# Patient Record
Sex: Male | Born: 1950 | Race: Black or African American | Hispanic: No | Smoking: Never smoker
Health system: Southern US, Community
[De-identification: ages and names within clinical notes are randomized; demographics above are authoritative.]

## PROBLEM LIST (undated history)

## (undated) DIAGNOSIS — E119 Type 2 diabetes mellitus without complications: Secondary | ICD-10-CM

---

## 2002-02-24 ENCOUNTER — Encounter: Payer: Self-pay | Admitting: Occupational Medicine

## 2002-02-24 ENCOUNTER — Encounter: Admission: RE | Admit: 2002-02-24 | Discharge: 2002-02-24 | Payer: Self-pay | Admitting: Occupational Medicine

## 2006-03-13 ENCOUNTER — Inpatient Hospital Stay (HOSPITAL_COMMUNITY): Admission: EM | Admit: 2006-03-13 | Discharge: 2006-03-18 | Payer: Self-pay | Admitting: *Deleted

## 2006-04-12 ENCOUNTER — Ambulatory Visit (HOSPITAL_COMMUNITY): Admission: RE | Admit: 2006-04-12 | Discharge: 2006-04-12 | Payer: Self-pay | Admitting: Gastroenterology

## 2006-05-13 ENCOUNTER — Ambulatory Visit: Payer: Self-pay | Admitting: Nurse Practitioner

## 2006-05-14 ENCOUNTER — Ambulatory Visit: Payer: Self-pay | Admitting: *Deleted

## 2007-01-22 ENCOUNTER — Encounter (INDEPENDENT_AMBULATORY_CARE_PROVIDER_SITE_OTHER): Payer: Self-pay | Admitting: *Deleted

## 2007-09-05 ENCOUNTER — Encounter (INDEPENDENT_AMBULATORY_CARE_PROVIDER_SITE_OTHER): Payer: Self-pay | Admitting: Nurse Practitioner

## 2007-09-05 ENCOUNTER — Ambulatory Visit: Payer: Self-pay | Admitting: Internal Medicine

## 2007-09-05 LAB — CONVERTED CEMR LAB
Albumin: 4.4 g/dL (ref 3.5–5.2)
BUN: 12 mg/dL (ref 6–23)
Calcium: 9.7 mg/dL (ref 8.4–10.5)
Chloride: 105 meq/L (ref 96–112)
Eosinophils Absolute: 0.1 10*3/uL (ref 0.0–0.7)
Glucose, Bld: 131 mg/dL — ABNORMAL HIGH (ref 70–99)
Hemoglobin: 18.1 g/dL — ABNORMAL HIGH (ref 13.0–17.0)
Lymphs Abs: 2.3 10*3/uL (ref 0.7–4.0)
MCV: 109 fL — ABNORMAL HIGH (ref 78.0–100.0)
Monocytes Relative: 7 % (ref 3–12)
Neutro Abs: 5 10*3/uL (ref 1.7–7.7)
Neutrophils Relative %: 63 % (ref 43–77)
PSA: 1.07 ng/mL (ref 0.10–4.00)
Potassium: 4.3 meq/L (ref 3.5–5.3)
RBC: 4.9 M/uL (ref 4.22–5.81)
WBC: 7.9 10*3/uL (ref 4.0–10.5)

## 2007-09-19 ENCOUNTER — Ambulatory Visit: Payer: Self-pay | Admitting: Oncology

## 2007-11-08 ENCOUNTER — Inpatient Hospital Stay (HOSPITAL_COMMUNITY): Admission: EM | Admit: 2007-11-08 | Discharge: 2007-11-17 | Payer: Self-pay | Admitting: Emergency Medicine

## 2010-01-02 ENCOUNTER — Ambulatory Visit: Payer: Self-pay | Admitting: Internal Medicine

## 2010-03-02 ENCOUNTER — Ambulatory Visit: Payer: Self-pay | Admitting: Internal Medicine

## 2010-09-19 NOTE — Consult Note (Signed)
Randy Nielsen, BELLANCA NO.:  [id number]   MEDICAL RECORD NO.:  [id number]          PATIENT TYPE:  INP   LOCATION:  5506                         FACILITY:  [facility]   PHYSICIAN:  Shirley Friar, MDDATE OF BIRTH:  28-Aug-1950   DATE OF CONSULTATION:  DATE OF DISCHARGE:                                 CONSULTATION   REQUESTING PHYSICIAN:  Dr. Ashley Royalty.   REASON FOR CONSULTATION:  Pancreatitis.   HISTORY OF PRESENT ILLNESS:  Randy Nielsen is a 60 year old black male  admitted on November 08, 2007, secondary to acute pancreatitis with a lipase  of 1928.  CT scan done on day of admission also showed enlarged  edematous pancreas with extensive peripancreatic edema compatible with  acute pancreatitis.  There is also intra and extrahepatic biliary ductal  dilatation seen without a definite pancreatic mass or stone noted.  Common bile duct was noted to measure 16 mm in diameter above the  pancreatic head and 13 mm within the pancreatic head.  No pseudocysts  were seen.  Randy Nielsen abdominal pain has resolved, and he has been on  a solid food without any problems.  His lipase has decreased from 1928  down to 171 on November 13, 2007, but has increased to 272 on November 15, 2007.  He continues to deny any abdominal pain.  His liver tests were last  checked on November 12, 2007, and his bilirubin was 3.0 up from 1.4, ALP was  90, AST 43, ALT 26.  In 2007, the patient had an episode of pancreatitis  and a CT at that time showed what appeared to be a possible pancreatic  head mass and his CA-19-9 was also elevated up to 3128.  He was  recommended to have a endoscopic ultrasound at that time and appears he  did not follow up for that.  He denies any recent alcohol use but  reports a remote history of alcohol abuse years ago.   PAST MEDICAL HISTORY:  Diabetes mellitus, otherwise negative except as  stated above.   MEDICATIONS:  Insulin, multivitamin, Protonix, thiamine, subcu heparin.   ALLERGIES:  No known drug allergies.   FAMILY HISTORY:  Noncontributory.   SOCIAL HISTORY:  History of alcohol abuse (denies any alcohol abuse  currently).  He says he drinks beer occasionally.   PHYSICAL EXAMINATION:  VITAL SIGNS:  Temperature 98.9, pulse 86, blood  pressure 157/94, O2 sat is 96% on room air.  GENERAL:  Alert and in no acute stress.  ABDOMEN:  Soft, nontender, and nondistended.  Active bowel sounds.   IMPRESSION:  A 60 year old black male with acute pancreatitis in the  setting of history of alcohol abuse.  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  I would recommend a repeat CT scan to look for any  evolving pseudocysts.  In 4 to 8 weeks, I will recommend an endoscopic  ultrasound to further evaluate his pancreatic parenchyma, look for any  subtle pancreatic lesions.  This was recommended in 2007 as well and  appears that he did not follow through on  this recommendation.  I will keep him n.p.o. after midnight for repeat  CT scan.  If that is negative, then we will do an endoscopic ultrasound  as an outpatient.  He may need to be on pancreatic enzymes as well, but  would recommend holding off on that until his endoscopic ultrasound.      Shirley Friar, MD  Electronically Signed     VCS/MEDQ  D:  11/15/2007  T:  11/16/2007  Job:  [id number]

## 2010-09-19 NOTE — Discharge Summary (Signed)
Nielsen, Randy NO.:  [id number]   MEDICAL RECORD NO.:  [id number]          PATIENT TYPE:  INP   LOCATION:  5506                         FACILITY:  [facility]   PHYSICIAN:  Altha Harm, MDDATE OF BIRTH:  December 11, 1950   DATE OF ADMISSION:  11/07/2007  DATE OF DISCHARGE:                               DISCHARGE SUMMARY   DISCHARGE DISPOSITION:  Home.   DISCHARGE DIAGNOSES:  1. Acute pancreatitis with pseudocyst.  2. Abdominal pain, resolved.  3. Emesis, resolved.  4. Diabetes type 2.  5. Hypokalemia, resolved.  6. History of hypertension, blood pressure is controlled without      medications.  7. Leukocytosis, resolved.   DISCHARGE MEDICATIONS:  1. Insulin NPH on a sliding scale.  2. Multivitamin 1 tablet p.o. daily.  3. Protonix 40 mg p.o. daily.  4. Thiamine 100 mg p.o. daily.  5. Folate 1 mg p.o. daily.   CONSULTANTS:  Dr. Shirley Friar, gastroenterology.   PROCEDURES:  None.   DIAGNOSTIC STUDIES:  1. Abdominal x-ray two-view, which shows no acute abnormalities.  2. Abdominal CT with contrast which shows enlarged edematous pancreas      with extensive peripancreatic edema, compatible with acute      pancreatitis.  Intrahepatic and extrahepatic biliary dilatation      without definite visualization of the pancreas, mass or stone.  A      follow-up CT examination following resolution of acute inflammatory      process recommended to exclude underlying obstructing mass of the      pancreatic head.  3. Repeat CT with contrast done on October 17, 2007, which showed      amorphous soft tissue mass in the head of the pancreas with      measurements given above.  This lesion was present on a CT      examination from December 2007, and has increased only minimally in      size since that time.  A very slow-growing pancreatic neoplasm is      present, which may protrude into the duodenum.  It accounts for the      intra and extrahepatic biliary  ductal dilatation.  4. Regional lymphadenopathy, unchanged from in the prior CTs in      December 2007.  5. Developing large pseudocyst between the stomach and the pancreas.      Smaller developing pseudocyst inferior to the pancreas.  Possible      developing pseudocyst in the gastrohepatic ligament.  No evidence      of pancreatic necrosis.  6. Mild bibasilar atelectasis, left greater than right.  7. Small left pleural effusion.   PERTINENT LABORATORY STUDIES:  Lipase on admission is 1982.  LFTs were  normal at that time.  Lipase at the time of discharge 292.  A cancer  antigen GI 19-9 was done, which was 361.2, which is significantly down  from the antigen level in 2007, which at that time was 3128.7 in  November 2007.   CHIEF COMPLAINT:  Abdominal pain.   HISTORY OF PRESENT ILLNESS:  Please see the H&P  dictated by Dr. Corky Downs  for details of the HPI.   HOSPITAL COURSE:  1. Acute pancreatitis.  The patient was given bowel rest and      aggressive IV hydration.  The patient resolved his pain and      vomiting and was started on clear liquid diet and advanced.      However, the patient's enzymes started to increase, which did not      follow up the expected clinical course.  The patient was again made      n.p.o. without any good resolution of the enzymes.  Repeat CT scan      was done which shows a pseudocyst and possibility of a mass of the      pancreatic head.  Gastroenterology was consulted.  The patient was      recommended for endoscopic ultrasound to be done in approximately 2-      3 weeks.  The patient in the past in 2007, had been recommended to      have an endoscopic ultrasound done; however, he did not follow up      with Dr. Bosie Clos at that time.  On this admission, I made contact      with Dr. Christella Hartigan, who does the endoscopic ultrasounds and the      patient is to call Dr. Christella Hartigan with the number provided, [phone],      to make an appointment in 2 weeks.  In terms  of the patient's      current acute course, the pancreatic enzymes have started to      decrease and are now down to 292.  I have discussed with Dr.      Bosie Clos, who feels that the patient can be discharged home to      follow up with him in 1 week to have his labs checked and for      evaluation in the outpatient setting.  Thus, the patient is being      discharged home with instructions to follow up with Dr. Bosie Clos in      1 week and to have a low-fat diet.  In terms of the diabetes type      2, the patient required only minimal sliding scale correction doses      while hospitalized and his blood pressures were well controlled.      The patient is being discharged with NPH insulin on a sliding      scale.  The patient does not have a primary care physician and has      been counseled that given his chronic diseases it is rather      important that he establish with a primary care physician as an      outpatient.  His hemoglobin A1c on this admission was 7.2, which      demonstrates poor control of his diabetes.  [facility]  Otherwise, the patient has remained stable and is being      discharged home with the above instructions.   DIETARY RESTRICTIONS:  The patient should be on a diabetic low-fat diet.   PHYSICAL RESTRICTIONS:  None.      Altha Harm, MD  Electronically Signed     MAM/MEDQ  D:  11/17/2007  T:  11/17/2007  Job:  [id number]   cc:   Laurena Bering.  Bosie Clos, MD  Christella Hartigan, MD
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
## 2010-09-22 NOTE — H&P (Signed)
NAME:  Randy Nielsen, Randy Nielsen NO.:  [id number]   MEDICAL RECORD NO.:  [id number]          PATIENT TYPE:  EMS   LOCATION:  MAJO                         FACILITY:  [facility]   PHYSICIAN:  Lucita Ferrara, MD         DATE OF BIRTH:  1951/01/28   DATE OF ADMISSION:  03/12/2006  DATE OF DISCHARGE:                                HISTORY & PHYSICAL   HISTORY OF PRESENT ILLNESS:  The patient is a 60 year old male with no  significant past medical history who presents in the emergency room after 3-  4 week mid epigastric abdominal pain that progressively got worse and became  excruciating.  Pain is currently 10/10 in intensity.  Three to four weeks  ago abdominal pain was accompanied with nausea and vomiting.  He states that  he started vomiting with the smell of any type of food.  His nausea,  vomiting and abdominal pain became progressively worse to the point where he  could not tolerate any p.o. intake today.  He said that over the last 3-4  weeks he lost a significant amount of weight.  He denied any chest pain,  related cardiac or shortness of breath.  He denies any blood in the stool,  changes in bowel habits.   PAST MEDICAL HISTORY:  None.   SOCIAL HISTORY:  The patient is a smoker within the last 12 months.  He  smokes a half pack per day.  He is an occasional drinker.  He drinks about  half pint per week.  He denies any drug abuse.   FAMILY HISTORY:  He did not remember history of father and mother.  He did  not have close contact with them.   PAST SURGICAL HISTORY:  He has right wrist surgery.  Other than that, no  significant surgical or past medical history.   ALLERGIES:  No known drug allergies.   MEDICATIONS:  None.   PHYSICAL EXAMINATION:  GENERAL:  The patient is in no respiratory distress.  Although, his abdominal pain is causing significant amount of distress.  Well controlled on pain medications.  He is alert and oriented.  VITAL SIGNS:  Blood pressure 185/112  with pain control down to 146/82,  temperature 97, pulse 100, respirations 20.  HEENT:  Normocephalic, atraumatic.  Sclerae are anicteric.  No jaundice  noted.  NECK:  No JVD or carotid bruits.  Supple.  CARDIOVASCULAR:  S1, S2.  Regular rate and rhythm.  No murmurs, rubs or  clicks.  LUNGS:  Clear to auscultation bilaterally.  Good air movement.  No wheezes  or rhonchi.  ABDOMEN:  Mildly distended in mid epigastric region.  There is some  guarding.  There is tenderness to light palpation.  There is good bowel  sounds.  Does not look like a surgical acute abdomen.  EXTREMITIES:  No cyanosis, clubbing or edema.  NEUROLOGICAL:  Alert and oriented x3.  Cranial nerves 2-3 grossly intact.  Pulses 2+ bilaterally upper and lower extremities.   LABORATORY DATA:  White count was 9.2, hemoglobin 17.9, hematocrit 52,  platelets 162.  Urinalysis showed trace hemoglobin.  Essentially, urinalysis  was negative.  Sodium 129, potassium 4.4, chloride 91, CO2 16, glucose 468,  BUN 23, creatinine 1.4, calcium 8.8, AST 28, ALT 62, alk-phos 231, bilirubin  3.1, lipase was 4613.  CT scan of the abdomen was significant for pancreatic  head mass causing biliary dilatation, worrisome for pancreatic malignancy.  In addition, there was significant stranding of the pancreas which could be  caused either by inflammation of the pancrease versus tumor infiltration of  the pancreas.  Pain was well controlled with the Dilaudid and the nausea and  vomiting was well controlled with Zofran.   ASSESSMENT/PLAN:  This is a 60 year old male with:  1. Pancreatic insufficiencies causing secondary diabetes.  He is currently      clinically diabetic ketoacidosis.  Will go ahead and treat him with      aggressive fluid, hydration and admit him to the intensive care unit      for IV insulin drip.  2. 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  I spoke to the patient.      I explained that this may be the case.  The patient  seemed, although in      shock, but understands that this is a possibility.  I told him about      his hospital course.  I told him that we may get consultants to help [place]      with this situation.  I think that it is prudent to first control his      diabetic ketoacidosis, his hydration status and his pancreatitis before      the ultimate goal of dealing with his pancreatic head mass.   I have explained the hospital course and procedures, and the patient  understands.      Lucita Ferrara, MD  Electronically Signed     RR/MEDQ  D:  03/13/2006  T:  03/13/2006  Job:  [id number]

## 2010-09-22 NOTE — Discharge Summary (Signed)
Randy Nielsen, Randy Nielsen NO.:  [id number]   MEDICAL RECORD NO.:  [id number]          PATIENT TYPE:  INP   LOCATION:  3034                         FACILITY:  [facility]   PHYSICIAN:  Deirdre Peer. Polite, M.D. DATE OF BIRTH:  [birth year]   DATE OF ADMISSION:  03/12/2006  DATE OF DISCHARGE:                               DISCHARGE SUMMARY   DISCHARGE DIAGNOSES:  1. Newly diagnosed diabetes, discharged on Lantus and oral      hypoglycemic.  Will be scheduled for outpatient diabetes education.  2. Pancreatic mass, suspicious for cancer.  CA 19-9 elevated at 3128.  3. Tobacco use, recommend stop.   DISCHARGE MEDICATIONS:  1. Lantus 20 units daily.  2. Amaryl for 4 mg daily.  At some point BP medication may be      recommended, i.e., ACE inhibitor.   DISPOSITION:  Being discharged home in stable condition.  Asked to  follow with Dr. Madilyn Fireman this week, Thursday or Friday.  Number has been  provided by Dr. Madilyn Fireman partner.  The patient will require diabetic  education on an outpatient basis and will need further studies in  reference to his pancreatic mass to rule out cancer but more than  likely, will require endoscopic ultrasound with biopsy.   STUDIES:  Hemoglobin A1c 16.6.  Lipase on admission 4613, as low as 343.  CA 19-9 3128.  B-MET unremarkable at discharge except for hyperglycemia.  CT:  Pancreatic head mass.   HISTORY OF PRESENT ILLNESS:  A 60 year old male and no past medical  history presented to the ED with nausea, vomiting, and abdominal pain.  The patient was evaluated and found to be hyperglycemic.  The patient  had imaging of his belly because of significant abdominal pain and found  to have a mass at the head of the pancreas.  Admission was deemed  necessary for further evaluation and treatment.  Please see dictated H&P  for further details.   PAST SURGICAL HISTORY:  None.   MEDICATIONS ON ADMISSION:  None.   SOCIAL HISTORY:  Positive for tobacco.  Social  alcohol.  No drugs.   FAMILY HISTORY:  Per admission H&P.   REVIEW OF SYSTEMS:  Per admission H&P.   ALLERGIES:  NO KNOWN DRUG ALLERGIES.   HOSPITAL COURSE:  The patient was admitted to ICU for evaluation and  treatment of the above problems.  The patient was initially treated with  IV insulin until optimal blood sugar control was obtained and resolution  of mild metabolic acidosis.  The patient was converted over to sliding-  scale insulin as well as Lantus.  The patient continued to have  hyperglycemia during hospitalization and managed with titrated insulin  as his diet allowed.  As for the evaluation of the patient's pancreatic  mass, the patient was seen by [facility].  Due to the patient's  pancreatitis and not wanting to exacerbate this, invasive studies have  been placed on hold.  The patient has been without nausea and vomiting  and had complete resolution of abdominal pain.  Further studies were  ordered, as stated above.  Clinically, the patient is doing well now  from the standpoint of abdominal pain and again tolerating a full diet  without nausea or vomiting.  The patient has been cleared for discharged  by [facility] with further outpatient followup, for endoscopic ultrasound  and biopsy.  This has been explained to the patient.  The patient will  be scheduled for diabetic education on an outpatient basis and has been  recommended to follow up with [facility].  At this time, patient is  medically stable for discharge.      Deirdre Peer. Polite, M.D.  Electronically Signed     RDP/MEDQ  D:  03/18/2006  T:  03/19/2006  Job:  [id number]   cc:   Everardo All. Madilyn Fireman, M.D.

## 2011-02-01 LAB — BASIC METABOLIC PANEL
BUN: 6
BUN: 8
CO2: 21
CO2: 23
CO2: 27
CO2: 28
Calcium: 8.1 — ABNORMAL LOW
Chloride: 102
Chloride: 103
Chloride: 109
Creatinine, Ser: 0.87
Creatinine, Ser: 0.92
GFR calc non Af Amer: >60
Glucose, Bld: 123 — ABNORMAL HIGH
Glucose, Bld: 125 — ABNORMAL HIGH
Glucose, Bld: 138 — ABNORMAL HIGH
Potassium: 3.8
Potassium: 3.8
Potassium: 5
Sodium: 139
Sodium: 139

## 2011-02-01 LAB — CBC
HCT: 41.2
HCT: 44.3
HCT: 53.4 — ABNORMAL HIGH
HCT: 54.8 — ABNORMAL HIGH
Hemoglobin: 15.8
Hemoglobin: 18.2 — ABNORMAL HIGH
Hemoglobin: 19.1 — ABNORMAL HIGH
MCHC: 34
MCHC: 35.1
MCHC: 35.5
MCHC: 35.6
MCHC: 35.7
MCV: 106.9 — ABNORMAL HIGH
MCV: 107.6 — ABNORMAL HIGH
MCV: 108.1 — ABNORMAL HIGH
MCV: 108.9 — ABNORMAL HIGH
Platelets: 130 — ABNORMAL LOW
Platelets: 135 — ABNORMAL LOW
Platelets: 198
RBC: 3.85 — ABNORMAL LOW
RBC: 3.87 — ABNORMAL LOW
RDW: 13.9
RDW: 14.4
RDW: 15
WBC: 10.8 — ABNORMAL HIGH
WBC: 16.5 — ABNORMAL HIGH
WBC: 9.9

## 2011-02-01 LAB — COMPREHENSIVE METABOLIC PANEL
ALT: 26
AST: 24
AST: 43 — ABNORMAL HIGH
Albumin: 2.5 — ABNORMAL LOW
Albumin: 2.7 — ABNORMAL LOW
Albumin: 3.9
Alkaline Phosphatase: 83
BUN: 8
BUN: 9
CO2: 25
Calcium: 8.4
Calcium: 8.8
Chloride: 105
Chloride: 99
Creatinine, Ser: 0.83
Creatinine, Ser: 0.86
GFR calc Af Amer: >60
GFR calc Af Amer: >60
GFR calc non Af Amer: >60
Glucose, Bld: 284 — ABNORMAL HIGH
Potassium: 4.9
Sodium: 135
Total Bilirubin: 1
Total Bilirubin: 1.4 — ABNORMAL HIGH
Total Protein: 6.5

## 2011-02-01 LAB — DIFFERENTIAL
Basophils Absolute: 0
Basophils Relative: 0
Basophils Relative: 0
Eosinophils Absolute: 0
Eosinophils Absolute: 0.2
Eosinophils Relative: 0
Eosinophils Relative: 2
Lymphs Abs: 1.9
Lymphs Abs: 2
Monocytes Absolute: 0.7
Monocytes Relative: 14 — ABNORMAL HIGH
Monocytes Relative: 15 — ABNORMAL HIGH
Neutro Abs: 14.9 — ABNORMAL HIGH
Neutro Abs: 7.6
Neutrophils Relative %: 64
Neutrophils Relative %: 66

## 2011-02-01 LAB — URINALYSIS, ROUTINE W REFLEX MICROSCOPIC
Hgb urine dipstick: NEGATIVE
Protein, ur: 100 — AB
Urobilinogen, UA: 1

## 2011-02-01 LAB — HEPATIC FUNCTION PANEL
ALT: 26
Alkaline Phosphatase: 96
Bilirubin, Direct: 0.3
Indirect Bilirubin: 0.6
Total Protein: 6.7

## 2011-02-01 LAB — LIPASE, BLOOD
Lipase: 171 — ABNORMAL HIGH
Lipase: 194 — ABNORMAL HIGH
Lipase: 292 — ABNORMAL HIGH

## 2011-02-01 LAB — CANCER ANTIGEN 19-9: CA 19-9: 361.2 — ABNORMAL HIGH (ref <35.0)

## 2011-02-01 LAB — LIPID PANEL
Cholesterol: 125
HDL: 20 — ABNORMAL LOW
Triglycerides: 138

## 2011-02-01 LAB — HEMOGLOBIN A1C: Hgb A1c MFr Bld: 7.2 — ABNORMAL HIGH

## 2011-02-01 LAB — URINE MICROSCOPIC-ADD ON

## 2017-09-09 ENCOUNTER — Inpatient Hospital Stay (HOSPITAL_COMMUNITY)
Admission: EM | Admit: 2017-09-09 | Discharge: 2017-09-16 | DRG: 435 | Disposition: A | Discharge status: Hospice | Payer: Medicare Other | Attending: Internal Medicine | Admitting: Internal Medicine

## 2017-09-09 ENCOUNTER — Emergency Department (HOSPITAL_COMMUNITY): Payer: Medicare Other

## 2017-09-09 DIAGNOSIS — R188 Other ascites: Secondary | ICD-10-CM

## 2017-09-09 DIAGNOSIS — C787 Secondary malignant neoplasm of liver and intrahepatic bile duct: Secondary | ICD-10-CM | POA: Diagnosis present

## 2017-09-09 DIAGNOSIS — K767 Hepatorenal syndrome: Secondary | ICD-10-CM | POA: Diagnosis present

## 2017-09-09 DIAGNOSIS — B1920 Unspecified viral hepatitis C without hepatic coma: Secondary | ICD-10-CM | POA: Diagnosis present

## 2017-09-09 DIAGNOSIS — D6959 Other secondary thrombocytopenia: Secondary | ICD-10-CM | POA: Diagnosis present

## 2017-09-09 DIAGNOSIS — Z7189 Other specified counseling: Secondary | ICD-10-CM

## 2017-09-09 DIAGNOSIS — N170 Acute kidney failure with tubular necrosis: Secondary | ICD-10-CM | POA: Diagnosis present

## 2017-09-09 DIAGNOSIS — R17 Unspecified jaundice: Secondary | ICD-10-CM

## 2017-09-09 DIAGNOSIS — E119 Type 2 diabetes mellitus without complications: Secondary | ICD-10-CM | POA: Diagnosis present

## 2017-09-09 DIAGNOSIS — C253 Malignant neoplasm of pancreatic duct: Principal | ICD-10-CM | POA: Diagnosis present

## 2017-09-09 DIAGNOSIS — E872 Acidosis: Secondary | ICD-10-CM | POA: Diagnosis present

## 2017-09-09 DIAGNOSIS — R1900 Intra-abdominal and pelvic swelling, mass and lump, unspecified site: Secondary | ICD-10-CM

## 2017-09-09 DIAGNOSIS — K831 Obstruction of bile duct: Secondary | ICD-10-CM | POA: Diagnosis present

## 2017-09-09 DIAGNOSIS — Z515 Encounter for palliative care: Secondary | ICD-10-CM | POA: Diagnosis present

## 2017-09-09 DIAGNOSIS — E43 Unspecified severe protein-calorie malnutrition: Secondary | ICD-10-CM | POA: Diagnosis present

## 2017-09-09 DIAGNOSIS — K859 Acute pancreatitis without necrosis or infection, unspecified: Secondary | ICD-10-CM | POA: Diagnosis present

## 2017-09-09 DIAGNOSIS — K8021 Calculus of gallbladder without cholecystitis with obstruction: Secondary | ICD-10-CM | POA: Diagnosis present

## 2017-09-09 DIAGNOSIS — C221 Intrahepatic bile duct carcinoma: Secondary | ICD-10-CM

## 2017-09-09 DIAGNOSIS — D630 Anemia in neoplastic disease: Secondary | ICD-10-CM | POA: Diagnosis present

## 2017-09-09 DIAGNOSIS — I10 Essential (primary) hypertension: Secondary | ICD-10-CM | POA: Diagnosis present

## 2017-09-09 DIAGNOSIS — Z66 Do not resuscitate: Secondary | ICD-10-CM | POA: Diagnosis present

## 2017-09-09 DIAGNOSIS — N179 Acute kidney failure, unspecified: Secondary | ICD-10-CM | POA: Diagnosis present

## 2017-09-09 DIAGNOSIS — R945 Abnormal results of liver function studies: Secondary | ICD-10-CM

## 2017-09-09 DIAGNOSIS — K8689 Other specified diseases of pancreas: Secondary | ICD-10-CM

## 2017-09-09 DIAGNOSIS — Z6823 Body mass index (BMI) 23.0-23.9, adult: Secondary | ICD-10-CM

## 2017-09-09 DIAGNOSIS — R180 Malignant ascites: Secondary | ICD-10-CM

## 2017-09-09 DIAGNOSIS — R109 Unspecified abdominal pain: Secondary | ICD-10-CM | POA: Diagnosis present

## 2017-09-09 DIAGNOSIS — K7460 Unspecified cirrhosis of liver: Secondary | ICD-10-CM | POA: Diagnosis present

## 2017-09-09 DIAGNOSIS — I959 Hypotension, unspecified: Secondary | ICD-10-CM | POA: Diagnosis present

## 2017-09-09 DIAGNOSIS — R791 Abnormal coagulation profile: Secondary | ICD-10-CM | POA: Diagnosis present

## 2017-09-09 DIAGNOSIS — R7989 Other specified abnormal findings of blood chemistry: Secondary | ICD-10-CM

## 2017-09-09 HISTORY — DX: Type 2 diabetes mellitus without complications: E11.9

## 2017-09-09 LAB — BASIC METABOLIC PANEL
Anion gap: 14 (ref 5–15)
BUN: 52 mg/dL — AB (ref 6–20)
CO2: 19 mmol/L — AB (ref 22–32)
CREATININE: 2.36 mg/dL — AB (ref 0.61–1.24)
Calcium: 8.2 mg/dL — ABNORMAL LOW (ref 8.9–10.3)
Chloride: 103 mmol/L (ref 101–111)
GFR calc Af Amer: 31 mL/min — ABNORMAL LOW (ref >60)
GFR calc non Af Amer: 27 mL/min — ABNORMAL LOW (ref >60)
Glucose, Bld: 65 mg/dL (ref 65–99)
Potassium: 3.7 mmol/L (ref 3.5–5.1)
Sodium: 136 mmol/L (ref 135–145)

## 2017-09-09 LAB — CBC
HCT: 33.9 % — ABNORMAL LOW (ref 39.0–52.0)
Hemoglobin: 11.9 g/dL — ABNORMAL LOW (ref 13.0–17.0)
MCH: 33.2 pg (ref 26.0–34.0)
MCHC: 35.1 g/dL (ref 30.0–36.0)
MCV: 94.7 fL (ref 78.0–100.0)
PLATELETS: 136 10*3/uL — AB (ref 150–400)
RBC: 3.58 MIL/uL — ABNORMAL LOW (ref 4.22–5.81)
RDW: 16.7 % — AB (ref 11.5–15.5)
WBC: 15.9 10*3/uL — ABNORMAL HIGH (ref 4.0–10.5)

## 2017-09-09 LAB — HEPATIC FUNCTION PANEL
ALBUMIN: 1.2 g/dL — AB (ref 3.5–5.0)
ALT: 55 U/L (ref 17–63)
AST: 152 U/L — ABNORMAL HIGH (ref 15–41)
Alkaline Phosphatase: 517 U/L — ABNORMAL HIGH (ref 38–126)
BILIRUBIN INDIRECT: 8.3 mg/dL — AB (ref 0.3–0.9)
Bilirubin, Direct: 9.8 mg/dL — ABNORMAL HIGH (ref 0.1–0.5)
TOTAL PROTEIN: 7.1 g/dL (ref 6.5–8.1)
Total Bilirubin: 18.1 mg/dL (ref 0.3–1.2)

## 2017-09-09 LAB — CBG MONITORING, ED
Glucose-Capillary: 55 mg/dL — ABNORMAL LOW (ref 65–99)
Glucose-Capillary: 70 mg/dL (ref 65–99)

## 2017-09-09 MED ORDER — SODIUM CHLORIDE 0.9 % IV BOLUS
1000.0000 mL | Freq: Once | INTRAVENOUS | Status: AC
Start: 2017-09-09 — End: 2017-09-10
  Administered 2017-09-09: 1000 mL via INTRAVENOUS

## 2017-09-09 MED ORDER — ONDANSETRON HCL 4 MG/2ML IJ SOLN
4.0000 mg | Freq: Once | INTRAMUSCULAR | Status: AC
  Administered 2017-09-09: 4 mg via INTRAVENOUS
  Filled 2017-09-09: qty 2

## 2017-09-09 MED ORDER — SODIUM CHLORIDE 0.9 % IV BOLUS
1000.0000 mL | Freq: Once | INTRAVENOUS | Status: AC
  Administered 2017-09-09: 1000 mL via INTRAVENOUS

## 2017-09-09 NOTE — ED Provider Notes (Signed)
[facility] EMERGENCY DEPARTMENT Provider Note   CSN: [id number] Arrival date & time: 09/09/17  1939     History   Chief Complaint Chief Complaint  Patient presents with  . Weakness    HPI ALTIN SEASE is a 67 y.o. male.  Pt presents to the ED today with weakness, nausea, and abd pain and swelling.  The pt's family said he has not been doing well all week.  He has not had an appetite.  He has not had f/c.  Pt has not been to the doctor in years, but has a hx of pancreatitis with pseudocyst on last admission here in 2012.  No known liver disease.     Past Medical History:  Diagnosis Date  . Diabetes mellitus without complication [facility])     Patient Active Problem List   Diagnosis Date Noted  . Abdominal pain 09/10/2017  . Obstructive jaundice 09/10/2017  . Ascites 09/10/2017  . ARF (acute renal failure) ([place]) 09/10/2017  . Diabetes mellitus type 2 in nonobese ([place]) 09/10/2017  . Abdominal mass   . Goals of care, counseling/discussion   . Palliative care by specialist     Past Surgical History:  Procedure Laterality Date  . IR PARACENTESIS  09/10/2017        Home Medications    Prior to Admission medications   Medication Sig Start Date End Date Taking? Authorizing Provider  naproxen sodium (ALEVE) 220 MG tablet Take 220 mg by mouth 2 (two) times daily as needed (for pain).   Yes [provider]    Family History Family History  Problem Relation Age of Onset  . Cancer Neg Hx     Social History Social History   Tobacco Use  . Smoking status: Never Smoker  . Smokeless tobacco: Never Used  Substance Use Topics  . Alcohol use: Not Currently  . Drug use: Not on file     Allergies   Patient has no known allergies.   Review of Systems Review of Systems  Constitutional: Positive for appetite change.  Gastrointestinal: Positive for abdominal pain, nausea and vomiting.  Neurological: Positive for weakness.  All other  systems reviewed and are negative.    Physical Exam Updated Vital Signs BP 98/65   Pulse 63   Temp 97.6 F (36.4 C)   Resp 16   Ht 5\' 6"  (1.676 m)   Wt 65.4 kg (144 lb 2.9 oz)   SpO2 100%   BMI 23.27 kg/m   Physical Exam  Constitutional: He is oriented to person, place, and time. He appears cachectic. He appears distressed.  HENT:  Head: Normocephalic and atraumatic.  Right Ear: External ear normal.  Left Ear: External ear normal.  Nose: Nose normal.  Mouth/Throat: Mucous membranes are dry.  Eyes: Pupils are equal, round, and reactive to light. EOM are normal. Scleral icterus is present.  Neck: Normal range of motion. Neck supple.  Cardiovascular: Normal rate, regular rhythm, normal heart sounds and intact distal pulses.  Pulmonary/Chest: Effort normal and breath sounds normal.  Abdominal: Soft. He exhibits fluid wave and ascites. Bowel sounds are decreased. There is generalized tenderness.  Musculoskeletal: Normal range of motion.  Neurological: He is alert and oriented to person, place, and time.  Skin: Skin is warm. Capillary refill takes less than 2 seconds.  Psychiatric: He has a normal mood and affect.  Nursing note and vitals reviewed.    ED Treatments / Results  Labs (all labs ordered are listed, but only  abnormal results are displayed) Labs Reviewed  BASIC METABOLIC PANEL - Abnormal; Notable for the following components:      Result Value   CO2 19 (*)    BUN 52 (*)    Creatinine, Ser 2.36 (*)    Calcium 8.2 (*)    GFR calc non Af Amer 27 (*)    GFR calc Af Amer 31 (*)    All other components within normal limits  CBC - Abnormal; Notable for the following components:   WBC 15.9 (*)    RBC 3.58 (*)    Hemoglobin 11.9 (*)    HCT 33.9 (*)    RDW 16.7 (*)    Platelets 136 (*)    All other components within normal limits  URINALYSIS, ROUTINE W REFLEX MICROSCOPIC - Abnormal; Notable for the following components:   Color, Urine AMBER (*)    APPearance  HAZY (*)    Bilirubin Urine MODERATE (*)    Protein, ur 30 (*)    Bacteria, UA FEW (*)    All other components within normal limits  HEPATIC FUNCTION PANEL - Abnormal; Notable for the following components:   Albumin 1.2 (*)    AST 152 (*)    Alkaline Phosphatase 517 (*)    Total Bilirubin 18.1 (*)    Bilirubin, Direct 9.8 (*)    Indirect Bilirubin 8.3 (*)    All other components within normal limits  LIPASE, BLOOD - Abnormal; Notable for the following components:   Lipase 810 (*)    All other components within normal limits  PROTIME-INR - Abnormal; Notable for the following components:   Prothrombin Time 26.4 (*)    All other components within normal limits  APTT - Abnormal; Notable for the following components:   aPTT 52 (*)    All other components within normal limits  BASIC METABOLIC PANEL - Abnormal; Notable for the following components:   CO2 17 (*)    BUN 51 (*)    Creatinine, Ser 2.45 (*)    Calcium 7.8 (*)    GFR calc non Af Amer 26 (*)    GFR calc Af Amer 30 (*)    All other components within normal limits  HEPATIC FUNCTION PANEL - Abnormal; Notable for the following components:   Total Protein 6.4 (*)    Albumin 1.1 (*)    AST 143 (*)    Alkaline Phosphatase 483 (*)    Total Bilirubin 16.7 (*)    Bilirubin, Direct 9.6 (*)    Indirect Bilirubin 7.1 (*)    All other components within normal limits  CBC WITH DIFFERENTIAL/PLATELET - Abnormal; Notable for the following components:   WBC 11.3 (*)    RBC 3.20 (*)    Hemoglobin 10.5 (*)    HCT 30.4 (*)    RDW 16.9 (*)    Platelets 114 (*)    Neutro Abs 9.0 (*)    All other components within normal limits  VITAMIN B12 - Abnormal; Notable for the following components:   Vitamin B-12 >7,500 (*)    All other components within normal limits  IRON AND TIBC - Abnormal; Notable for the following components:   Iron 26 (*)    All other components within normal limits  FERRITIN - Abnormal; Notable for the following  components:   Ferritin 1,819 (*)    All other components within normal limits  RETICULOCYTES - Abnormal; Notable for the following components:   RBC. 3.20 (*)    All other components  within normal limits  TROPONIN I - Abnormal; Notable for the following components:   Troponin I 0.30 (*)    All other components within normal limits  LACTATE DEHYDROGENASE, PLEURAL OR PERITONEAL FLUID - Abnormal; Notable for the following components:   LD, Fluid 38 (*)    All other components within normal limits  BODY FLUID CELL COUNT WITH DIFFERENTIAL - Abnormal; Notable for the following components:   Neutrophil Count, Fluid 45 (*)    Monocyte-Macrophage-Serous Fluid 22 (*)    All other components within normal limits  GLUCOSE, CAPILLARY - Abnormal; Notable for the following components:   Glucose-Capillary 58 (*)    All other components within normal limits  CBG MONITORING, ED - Abnormal; Notable for the following components:   Glucose-Capillary 55 (*)    All other components within normal limits  BODY FLUID CULTURE  HEMOGLOBIN A1C  FOLATE  ALBUMIN, PLEURAL OR PERITONEAL FLUID  PROTEIN, PLEURAL OR PERITONEAL FLUID  GLUCOSE, PLEURAL OR PERITONEAL FLUID  GLUCOSE, CAPILLARY  CBC  HEPATITIS PANEL, ACUTE  SODIUM, URINE, RANDOM  CREATININE, URINE, RANDOM  CBG MONITORING, ED  CYTOLOGY - NON PAP    EKG EKG Interpretation  Date/Time:  Monday Sep 09 2017 21:13:11 EDT Ventricular Rate:  83 PR Interval:  154 QRS Duration: 84 QT Interval:  412 QTC Calculation: 484 R Axis:   0 Text Interpretation:  Normal sinus rhythm Anterior infarct , age undetermined Abnormal ECG No old tracing to compare Confirmed by Isla Pence [phone]) on 09/09/2017 9:32:07 PM   Radiology Ct Abdomen Pelvis Wo Contrast  Result Date: 09/10/2017 CLINICAL DATA:  67 y/o M; weakness, abdominal pain, swelling, jaundice, nausea, and not eating. EXAM: CT ABDOMEN AND PELVIS WITHOUT CONTRAST TECHNIQUE: Multidetector CT imaging of  the abdomen and pelvis was performed following the standard protocol without IV contrast. COMPARISON:  11/16/2007 CT of abdomen and pelvis FINDINGS: Lower chest: Small left pleural effusion. Hepatobiliary: Severe intra and extrahepatic biliary ductal dilatation and distention of the gallbladder. Small gallstones. The common bile duct is dilated to the pancreatic head where it abruptly tapers (series 6, image 78). Common bile duct measures up to 3.3 cm in diameter. Multiple hypoattenuating pancreatic lesions, several with coarse calcifications, the largest in the right lobe of the liver peripherally spanning 6.8 x 6.0 cm (series 3, image 21). The large right lobe of liver lesion has a suggestion of capsular retraction. Pancreas: No significant dilatation of the pancreatic duct. No peripancreatic inflammatory change. Spleen: Normal in size without focal abnormality. Adrenals/Urinary Tract: Adrenal glands are unremarkable. Kidneys are normal, without renal calculi, focal lesion, or hydronephrosis. Bladder is unremarkable. Stomach/Bowel: Stomach is within normal limits. Appendix not identified. No evidence of bowel wall thickening, distention, or inflammatory changes. Vascular/Lymphatic: Aortic atherosclerosis. No enlarged abdominal or pelvic lymph nodes. Reproductive: Prostate is unremarkable. Other: Large volume of peritoneal ascites. Musculoskeletal: No fracture is seen. IMPRESSION: 1. Severe intra and extrahepatic biliary ductal dilatation and distention of the gallbladder. Abrupt tapering of the common bile duct within pancreatic head may represent underlying mass, stricture, or radiolucent stone. 2. Large volume of peritoneal ascites. 3. Ill-defined hypoattenuating lesions within the liver with coarse calcifications measuring up to 6.8 cm in the right lobe of the liver. Findings of capsular retraction suggest cholangiocarcinoma. 4. When the patient is clinically stable and able to follow directions and hold their  breath further evaluation with dedicated abdominal MRI should be considered. Electronically Signed   By: Kristine Garbe M.D.   On: 09/10/2017 03:00  Dg Chest Port 1 View  Result Date: 09/10/2017 CLINICAL DATA:  Abdominal pain and swelling for 2 days EXAM: PORTABLE CHEST 1 VIEW COMPARISON:  None. FINDINGS: The lungs are not optimally aerated, with some elevation of the right hemidiaphragm and mild volume loss at the left lung base. However no definite pneumonia is seen and no pleural effusion is noted. Mediastinal and hilar contours are unremarkable and the heart is within upper limits of normal. No bony abnormality is seen. IMPRESSION: Poor inspiration with mild volume loss at the lung bases. No definite active process. Electronically Signed   By: Ivar Drape M.D.   On: 09/10/2017 08:09   Ir Paracentesis  Result Date: 09/10/2017 INDICATION: Patient with history of abdominal pain, jaundice, elevated liver function tests/creatinine, intra/extrahepatic biliary ductal dilatation, liver lesions, ascites. Request made for diagnostic and therapeutic paracentesis. EXAM: ULTRASOUND GUIDED DIAGNOSTIC AND THERAPEUTIC PARACENTESIS MEDICATIONS: None COMPLICATIONS: None immediate. PROCEDURE: Informed written consent was obtained from the patient after a discussion of the risks, benefits and alternatives to treatment. A timeout was performed prior to the initiation of the procedure. Initial ultrasound scanning demonstrates a moderate amount of ascites within the left lower abdominal quadrant. The left lower abdomen was prepped and draped in the usual sterile fashion. 2% lidocaine was used for local anesthesia. Following this, a 6 Fr Safe-T-Centesis catheter was introduced. An ultrasound image was saved for documentation purposes. The paracentesis was performed. The catheter was removed and a dressing was applied. The patient tolerated the procedure well without immediate post procedural complication. FINDINGS: A  total of approximately 2.5 liters of golden yellow fluid was removed. Samples were sent to the laboratory as requested by the clinical team. IMPRESSION: Successful ultrasound-guided diagnostic and therapeutic paracentesis yielding 2.5 liters of peritoneal fluid. Read by: Rowe Robert, PA-C Electronically Signed   By: Jerilynn Mages.  Shick M.D.   On: 09/10/2017 12:05    Procedures Procedures (including critical care time)  Medications Ordered in ED Medications  acetaminophen (TYLENOL) tablet 650 mg (has no administration in time range)    Or  acetaminophen (TYLENOL) suppository 650 mg (has no administration in time range)  ondansetron (ZOFRAN) tablet 4 mg (has no administration in time range)    Or  ondansetron (ZOFRAN) injection 4 mg (has no administration in time range)  piperacillin-tazobactam (ZOSYN) IVPB 3.375 g (0 g Intravenous Stopped 09/10/17 1457)  lidocaine (XYLOCAINE) 2 % injection (  Not Given 09/10/17 1505)  lidocaine (XYLOCAINE) 2 % injection (10 mLs Infiltration Given 09/10/17 1131)  sodium chloride 0.9 % bolus 1,000 mL (0 mLs Intravenous Stopped 09/10/17 0052)  sodium chloride 0.9 % bolus 1,000 mL (0 mLs Intravenous Stopped 09/10/17 0052)  ondansetron (ZOFRAN) injection 4 mg (4 mg Intravenous Given 09/09/17 2302)  phytonadione (VITAMIN K) 10 mg in dextrose 5 % 50 mL IVPB (0 mg Intravenous Stopped 09/10/17 1601)     Initial Impression / Assessment and Plan / ED Course  I have reviewed the triage vital signs and the nursing notes.  Pertinent labs & imaging results that were available during my care of the patient were reviewed by me and considered in my medical decision making (see chart for details).     Pt given IVFs and BP has improved.  Pt signed out to Dr. Randal Buba at shift change.  Pt will need admission.  Final Clinical Impressions(s) / ED Diagnoses   Final diagnoses:  Acute renal failure with tubular necrosis (HCC)  Malignant ascites  Pancreatic mass  Hyperbilirubinemia    ED  Discharge Orders    None       Isla Pence, MD 09/10/17 [phone]

## 2017-09-09 NOTE — ED Notes (Signed)
EDP aware of pt BP 

## 2017-09-09 NOTE — ED Triage Notes (Signed)
For the past week pt has not been eating, generalized weakness, abd pain and swelling, jaundice, nausea. Pt is hypotensive and hypoglycemic

## 2017-09-09 NOTE — ED Notes (Signed)
IV access attempted X2 by this RN without success 

## 2017-09-09 NOTE — ED Notes (Signed)
IV team at the bedside. 

## 2017-09-09 NOTE — ED Notes (Signed)
IV access attempted X2 without success. 

## 2017-09-10 ENCOUNTER — Inpatient Hospital Stay (HOSPITAL_COMMUNITY): Payer: Medicare Other

## 2017-09-10 ENCOUNTER — Encounter (HOSPITAL_COMMUNITY): Payer: Self-pay | Admitting: Internal Medicine

## 2017-09-10 DIAGNOSIS — R188 Other ascites: Secondary | ICD-10-CM | POA: Diagnosis present

## 2017-09-10 DIAGNOSIS — I10 Essential (primary) hypertension: Secondary | ICD-10-CM | POA: Diagnosis present

## 2017-09-10 DIAGNOSIS — R1901 Right upper quadrant abdominal swelling, mass and lump: Secondary | ICD-10-CM | POA: Diagnosis not present

## 2017-09-10 DIAGNOSIS — D6959 Other secondary thrombocytopenia: Secondary | ICD-10-CM | POA: Diagnosis present

## 2017-09-10 DIAGNOSIS — K8021 Calculus of gallbladder without cholecystitis with obstruction: Secondary | ICD-10-CM | POA: Diagnosis present

## 2017-09-10 DIAGNOSIS — K859 Acute pancreatitis without necrosis or infection, unspecified: Secondary | ICD-10-CM | POA: Diagnosis present

## 2017-09-10 DIAGNOSIS — N170 Acute kidney failure with tubular necrosis: Secondary | ICD-10-CM

## 2017-09-10 DIAGNOSIS — R1900 Intra-abdominal and pelvic swelling, mass and lump, unspecified site: Secondary | ICD-10-CM

## 2017-09-10 DIAGNOSIS — N179 Acute kidney failure, unspecified: Secondary | ICD-10-CM | POA: Diagnosis present

## 2017-09-10 DIAGNOSIS — Z515 Encounter for palliative care: Secondary | ICD-10-CM

## 2017-09-10 DIAGNOSIS — R945 Abnormal results of liver function studies: Secondary | ICD-10-CM | POA: Diagnosis not present

## 2017-09-10 DIAGNOSIS — C221 Intrahepatic bile duct carcinoma: Secondary | ICD-10-CM | POA: Diagnosis not present

## 2017-09-10 DIAGNOSIS — Z7189 Other specified counseling: Secondary | ICD-10-CM | POA: Diagnosis not present

## 2017-09-10 DIAGNOSIS — E872 Acidosis: Secondary | ICD-10-CM | POA: Diagnosis present

## 2017-09-10 DIAGNOSIS — R1084 Generalized abdominal pain: Secondary | ICD-10-CM

## 2017-09-10 DIAGNOSIS — R791 Abnormal coagulation profile: Secondary | ICD-10-CM | POA: Diagnosis present

## 2017-09-10 DIAGNOSIS — C253 Malignant neoplasm of pancreatic duct: Secondary | ICD-10-CM | POA: Diagnosis present

## 2017-09-10 DIAGNOSIS — K831 Obstruction of bile duct: Secondary | ICD-10-CM | POA: Diagnosis present

## 2017-09-10 DIAGNOSIS — E119 Type 2 diabetes mellitus without complications: Secondary | ICD-10-CM

## 2017-09-10 DIAGNOSIS — Z6823 Body mass index (BMI) 23.0-23.9, adult: Secondary | ICD-10-CM | POA: Diagnosis not present

## 2017-09-10 DIAGNOSIS — E43 Unspecified severe protein-calorie malnutrition: Secondary | ICD-10-CM | POA: Diagnosis present

## 2017-09-10 DIAGNOSIS — B1920 Unspecified viral hepatitis C without hepatic coma: Secondary | ICD-10-CM | POA: Diagnosis present

## 2017-09-10 DIAGNOSIS — Z66 Do not resuscitate: Secondary | ICD-10-CM | POA: Diagnosis present

## 2017-09-10 DIAGNOSIS — K767 Hepatorenal syndrome: Secondary | ICD-10-CM | POA: Diagnosis present

## 2017-09-10 DIAGNOSIS — R109 Unspecified abdominal pain: Secondary | ICD-10-CM | POA: Diagnosis present

## 2017-09-10 DIAGNOSIS — C787 Secondary malignant neoplasm of liver and intrahepatic bile duct: Secondary | ICD-10-CM | POA: Diagnosis present

## 2017-09-10 DIAGNOSIS — D630 Anemia in neoplastic disease: Secondary | ICD-10-CM | POA: Diagnosis present

## 2017-09-10 DIAGNOSIS — K838 Other specified diseases of biliary tract: Secondary | ICD-10-CM | POA: Diagnosis not present

## 2017-09-10 DIAGNOSIS — K7460 Unspecified cirrhosis of liver: Secondary | ICD-10-CM | POA: Diagnosis present

## 2017-09-10 DIAGNOSIS — I959 Hypotension, unspecified: Secondary | ICD-10-CM | POA: Diagnosis present

## 2017-09-10 HISTORY — PX: IR PARACENTESIS: IMG2679

## 2017-09-10 LAB — BODY FLUID CELL COUNT WITH DIFFERENTIAL
Lymphs, Fluid: 33 %
Monocyte-Macrophage-Serous Fluid: 22 % — ABNORMAL LOW (ref 50–90)
Neutrophil Count, Fluid: 45 % — ABNORMAL HIGH (ref 0–25)
Total Nucleated Cell Count, Fluid: 47 uL (ref 0–1000)

## 2017-09-10 LAB — FERRITIN: FERRITIN: 1819 ng/mL — AB (ref 24–336)

## 2017-09-10 LAB — HEPATIC FUNCTION PANEL
ALBUMIN: 1.1 g/dL — AB (ref 3.5–5.0)
ALK PHOS: 483 U/L — AB (ref 38–126)
ALT: 49 U/L (ref 17–63)
AST: 143 U/L — AB (ref 15–41)
Bilirubin, Direct: 9.6 mg/dL — ABNORMAL HIGH (ref 0.1–0.5)
Indirect Bilirubin: 7.1 mg/dL — ABNORMAL HIGH (ref 0.3–0.9)
TOTAL PROTEIN: 6.4 g/dL — AB (ref 6.5–8.1)
Total Bilirubin: 16.7 mg/dL — ABNORMAL HIGH (ref 0.3–1.2)

## 2017-09-10 LAB — CBC WITH DIFFERENTIAL/PLATELET
BASOS ABS: 0 10*3/uL (ref 0.0–0.1)
Basophils Relative: 0 %
EOS PCT: 0 %
Eosinophils Absolute: 0 10*3/uL (ref 0.0–0.7)
HCT: 30.4 % — ABNORMAL LOW (ref 39.0–52.0)
HEMOGLOBIN: 10.5 g/dL — AB (ref 13.0–17.0)
LYMPHS ABS: 1.8 10*3/uL (ref 0.7–4.0)
LYMPHS PCT: 16 %
MCH: 32.8 pg (ref 26.0–34.0)
MCHC: 34.5 g/dL (ref 30.0–36.0)
MCV: 95 fL (ref 78.0–100.0)
Monocytes Absolute: 0.5 10*3/uL (ref 0.1–1.0)
Monocytes Relative: 4 %
NEUTROS ABS: 9 10*3/uL — AB (ref 1.7–7.7)
Neutrophils Relative %: 80 %
Platelets: 114 10*3/uL — ABNORMAL LOW (ref 150–400)
RBC: 3.2 MIL/uL — AB (ref 4.22–5.81)
RDW: 16.9 % — AB (ref 11.5–15.5)
WBC: 11.3 10*3/uL — ABNORMAL HIGH (ref 4.0–10.5)

## 2017-09-10 LAB — BASIC METABOLIC PANEL
Anion gap: 13 (ref 5–15)
BUN: 51 mg/dL — ABNORMAL HIGH (ref 6–20)
CHLORIDE: 106 mmol/L (ref 101–111)
CO2: 17 mmol/L — ABNORMAL LOW (ref 22–32)
CREATININE: 2.45 mg/dL — AB (ref 0.61–1.24)
Calcium: 7.8 mg/dL — ABNORMAL LOW (ref 8.9–10.3)
GFR calc non Af Amer: 26 mL/min — ABNORMAL LOW (ref >60)
GFR, EST AFRICAN AMERICAN: 30 mL/min — AB (ref >60)
Glucose, Bld: 68 mg/dL (ref 65–99)
Potassium: 3.6 mmol/L (ref 3.5–5.1)
SODIUM: 136 mmol/L (ref 135–145)

## 2017-09-10 LAB — HEMOGLOBIN A1C
HEMOGLOBIN A1C: 5.2 % (ref 4.8–5.6)
MEAN PLASMA GLUCOSE: 102.54 mg/dL

## 2017-09-10 LAB — APTT: aPTT: 52 seconds — ABNORMAL HIGH (ref 24–36)

## 2017-09-10 LAB — IRON AND TIBC: IRON: 26 ug/dL — AB (ref 45–182)

## 2017-09-10 LAB — URINALYSIS, ROUTINE W REFLEX MICROSCOPIC
Glucose, UA: NEGATIVE mg/dL
Hgb urine dipstick: NEGATIVE
KETONES UR: NEGATIVE mg/dL
LEUKOCYTES UA: NEGATIVE
Nitrite: NEGATIVE
PH: 5 (ref 5.0–8.0)
Protein, ur: 30 mg/dL — AB
SPECIFIC GRAVITY, URINE: 1.02 (ref 1.005–1.030)

## 2017-09-10 LAB — GLUCOSE, CAPILLARY
GLUCOSE-CAPILLARY: 71 mg/dL (ref 65–99)
Glucose-Capillary: 58 mg/dL — ABNORMAL LOW (ref 65–99)
Glucose-Capillary: 66 mg/dL (ref 65–99)
Glucose-Capillary: 68 mg/dL (ref 65–99)

## 2017-09-10 LAB — LIPASE, BLOOD: Lipase: 810 U/L — ABNORMAL HIGH (ref 11–51)

## 2017-09-10 LAB — PROTEIN, PLEURAL OR PERITONEAL FLUID

## 2017-09-10 LAB — LACTATE DEHYDROGENASE, PLEURAL OR PERITONEAL FLUID: LD, Fluid: 38 U/L — ABNORMAL HIGH (ref 3–23)

## 2017-09-10 LAB — ALBUMIN, PLEURAL OR PERITONEAL FLUID: Albumin, Fluid: <1 g/dL

## 2017-09-10 LAB — VITAMIN B12

## 2017-09-10 LAB — RETICULOCYTES
RBC.: 3.2 MIL/uL — ABNORMAL LOW (ref 4.22–5.81)
RETIC COUNT ABSOLUTE: 54.4 10*3/uL (ref 19.0–186.0)
RETIC CT PCT: 1.7 % (ref 0.4–3.1)

## 2017-09-10 LAB — PROTIME-INR
INR: 2.45
PROTHROMBIN TIME: 26.4 s — AB (ref 11.4–15.2)

## 2017-09-10 LAB — FOLATE: Folate: 7.4 ng/mL (ref >5.9)

## 2017-09-10 LAB — GLUCOSE, PLEURAL OR PERITONEAL FLUID: Glucose, Fluid: 76 mg/dL

## 2017-09-10 LAB — TROPONIN I: Troponin I: 0.3 ng/mL (ref <0.03)

## 2017-09-10 MED ORDER — ONDANSETRON HCL 4 MG PO TABS: 4.0000 mg | ORAL_TABLET | Freq: Four times a day (QID) | ORAL | Status: DC | PRN

## 2017-09-10 MED ORDER — ALBUMIN HUMAN 25 % IV SOLN
25.0000 g | INTRAVENOUS | Status: AC
  Administered 2017-09-11: 25 g via INTRAVENOUS
  Filled 2017-09-10: qty 100

## 2017-09-10 MED ORDER — LIDOCAINE HCL (PF) 2 % IJ SOLN
INTRAMUSCULAR | Status: AC
  Filled 2017-09-10: qty 20

## 2017-09-10 MED ORDER — ACETAMINOPHEN 325 MG PO TABS: 650.0000 mg | ORAL_TABLET | Freq: Four times a day (QID) | ORAL | Status: DC | PRN

## 2017-09-10 MED ORDER — PIPERACILLIN-TAZOBACTAM 3.375 G IVPB
3.3750 g | Freq: Three times a day (TID) | INTRAVENOUS | Status: DC
  Administered 2017-09-10 – 2017-09-12 (×7): 3.375 g via INTRAVENOUS
  Filled 2017-09-10 (×8): qty 50

## 2017-09-10 MED ORDER — SODIUM CHLORIDE 0.9 % IV SOLN
2.0000 g | INTRAVENOUS | Status: DC
  Administered 2017-09-10: 2 g via INTRAVENOUS
  Filled 2017-09-10: qty 20

## 2017-09-10 MED ORDER — VITAMIN K1 10 MG/ML IJ SOLN
10.0000 mg | Freq: Once | INTRAVENOUS | Status: AC
  Administered 2017-09-10: 10 mg via INTRAVENOUS
  Filled 2017-09-10: qty 1

## 2017-09-10 MED ORDER — LIDOCAINE HCL (PF) 2 % IJ SOLN
INTRAMUSCULAR | Status: DC | PRN
  Administered 2017-09-10: 10 mL

## 2017-09-10 MED ORDER — ONDANSETRON HCL 4 MG/2ML IJ SOLN
4.0000 mg | Freq: Four times a day (QID) | INTRAMUSCULAR | Status: DC | PRN
  Administered 2017-09-11: 4 mg via INTRAVENOUS
  Filled 2017-09-10: qty 2

## 2017-09-10 MED ORDER — ACETAMINOPHEN 650 MG RE SUPP: 650.0000 mg | Freq: Four times a day (QID) | RECTAL | Status: DC | PRN

## 2017-09-10 NOTE — Progress Notes (Addendum)
Hypoglycemic Event  CBG: 66  Treatment: 15 GM carbohydrate snack  Symptoms: None  Follow-up CBG: Time:1400 CBG Result:-  Possible Reasons for Event: Inadequate meal intake  Comments/MD notified:OJ was given. Patient refused recheck.    Randy Nielsen

## 2017-09-10 NOTE — Consult Note (Addendum)
Referring Provider:  [facility] Primary Care Physician:  [name], MD Primary Gastroenterologist: unassigned  Reason for Consultation:  Obstructive jaundice  HPI: Randy Nielsen is a 67 y.o. male with past medical history of diabetes presented to the hospital with generalized weakness, abdominal pain, abdominal distention and jaundice.upon initial evaluation, he was found to have jaundice with total bilirubin of 18.1. CT abdomen pelvis showed severe biliary ductal dilatation with CBD of up to 3.3 cm with abrupt tapering of CBD and pancreatic head. Around 6.8  lesion within the right lobe of liver concerning for cholangiocarcinoma.GI is consulted for further evaluation.  Patient seen and examined at bedside. Complaining of generalized weakness, abdominal distention, abdominal pain, nausea and vomiting since last 2 weeks. Patient lives with a girlfriend. According to her, patient is not doing well since last few weeks. 18 pound weight loss in last few weeks. Decreased appetite. Denies any blood in the stool or black stool. Denies dysphagia. Denies diarrhea or constipation.  Has been drinking more alcohol in last 6 months(beers)  after the death of her sister.   Past Medical History:  Diagnosis Date  . Diabetes mellitus without complication ([place])     History reviewed. No pertinent surgical history.  Prior to Admission medications   Medication Sig Start Date End Date Taking? Authorizing Provider  naproxen sodium (ALEVE) 220 MG tablet Take 220 mg by mouth 2 (two) times daily as needed (for pain).   Yes [provider]    Scheduled Meds: Continuous Infusions: . piperacillin-tazobactam (ZOSYN)  IV     PRN Meds:.acetaminophen **OR** acetaminophen, ondansetron **OR** ondansetron (ZOFRAN) IV  Allergies as of 09/09/2017  . (No Known Allergies)    Family History  Problem Relation Age of Onset  . Cancer Neg Hx     Social History   Socioeconomic History  . Marital status: Legally  Separated    Spouse name: Not on file  . Number of children: Not on file  . Years of education: Not on file  . Highest education level: Not on file  Occupational History  . Not on file  Social Needs  . Financial resource strain: Not on file  . Food insecurity:    Worry: Not on file    Inability: Not on file  . Transportation needs:    Medical: Not on file    Non-medical: Not on file  Tobacco Use  . Smoking status: Never Smoker  . Smokeless tobacco: Never Used  Substance and Sexual Activity  . Alcohol use: Not Currently  . Drug use: Not on file  . Sexual activity: Not on file  Lifestyle  . Physical activity:    Days per week: Not on file    Minutes per session: Not on file  . Stress: Not on file  Relationships  . Social connections:    Talks on phone: Not on file    Gets together: Not on file    Attends religious service: Not on file    Active member of club or organization: Not on file    Attends meetings of clubs or organizations: Not on file    Relationship status: Not on file  . Intimate partner violence:    Fear of current or ex partner: Not on file    Emotionally abused: Not on file    Physically abused: Not on file    Forced sexual activity: Not on file  Other Topics Concern  . Not on file  Social History Narrative  . Not on  file    Review of Systems: Review of Systems  Constitutional: Positive for malaise/fatigue and weight loss. Negative for chills and fever.  HENT: Negative for hearing loss and tinnitus.   Eyes: Negative for blurred vision and double vision.  Respiratory: Positive for cough and sputum production. Negative for hemoptysis.   Cardiovascular: Negative for chest pain and palpitations.  Gastrointestinal: Positive for abdominal pain, heartburn, nausea and vomiting. Negative for blood in stool, constipation, diarrhea and melena.  Genitourinary: Negative for dysuria and urgency.  Musculoskeletal: Positive for joint pain and myalgias.  Skin:  Negative for itching and rash.  Neurological: Negative for seizures and loss of consciousness.  Endo/Heme/Allergies: Does not bruise/bleed easily.  Psychiatric/Behavioral: Negative for hallucinations and suicidal ideas.    Physical Exam: Vital signs: Vitals:   09/10/17 0315 09/10/17 0500  BP: 99/70 111/64  Pulse: 72 (!) 129  Resp: 15 14  Temp:    SpO2: 100% 98%   Last BM Date: 09/10/17 Physical Exam  Constitutional: He is oriented to person, place, and time. No distress.  Cachectic-appearing patient. Not in acute distress  HENT:  Head: Normocephalic and atraumatic.  Mouth/Throat: Oropharynx is clear and moist. No oropharyngeal exudate.  Eyes: EOM are normal. Scleral icterus is present.  Neck: Normal range of motion. Neck supple.  Cardiovascular: Normal rate and regular rhythm.  Murmur heard. Pulmonary/Chest: Effort normal and breath sounds normal. No respiratory distress.  Abdominal: Bowel sounds are normal. He exhibits distension. There is no rebound and no guarding.  Significantly distended abdomen with generalized discomfort on palpation  Musculoskeletal: Normal range of motion. He exhibits edema.  Neurological: He is alert and oriented to person, place, and time.  Skin: Skin is dry. No erythema.  Psychiatric: He has a normal mood and affect. Judgment normal.  Vitals reviewed.   GI:  Lab Results: Recent Labs    09/09/17 2110 09/10/17 0418  WBC 15.9* 11.3*  HGB 11.9* 10.5*  HCT 33.9* 30.4*  PLT 136* 114*   BMET Recent Labs    09/09/17 2110 09/10/17 0418  NA 136 136  K 3.7 3.6  CL 103 106  CO2 19* 17*  GLUCOSE 65 68  BUN 52* 51*  CREATININE 2.36* 2.45*  CALCIUM 8.2* 7.8*   LFT Recent Labs    09/10/17 0418  PROT 6.4*  ALBUMIN 1.1*  AST 143*  ALT 49  ALKPHOS 483*  BILITOT 16.7*  BILIDIR 9.6*  IBILI 7.1*   PT/INR Recent Labs    09/10/17 0313  LABPROT 26.4*  INR 2.45     Studies/Results: Ct Abdomen Pelvis Wo Contrast  Result Date:  09/10/2017 CLINICAL DATA:  67 y/o M; weakness, abdominal pain, swelling, jaundice, nausea, and not eating. EXAM: CT ABDOMEN AND PELVIS WITHOUT CONTRAST TECHNIQUE: Multidetector CT imaging of the abdomen and pelvis was performed following the standard protocol without IV contrast. COMPARISON:  11/16/2007 CT of abdomen and pelvis FINDINGS: Lower chest: Small left pleural effusion. Hepatobiliary: Severe intra and extrahepatic biliary ductal dilatation and distention of the gallbladder. Small gallstones. The common bile duct is dilated to the pancreatic head where it abruptly tapers (series 6, image 78). Common bile duct measures up to 3.3 cm in diameter. Multiple hypoattenuating pancreatic lesions, several with coarse calcifications, the largest in the right lobe of the liver peripherally spanning 6.8 x 6.0 cm (series 3, image 21). The large right lobe of liver lesion has a suggestion of capsular retraction. Pancreas: No significant dilatation of the pancreatic duct. No peripancreatic inflammatory change. Spleen:  Normal in size without focal abnormality. Adrenals/Urinary Tract: Adrenal glands are unremarkable. Kidneys are normal, without renal calculi, focal lesion, or hydronephrosis. Bladder is unremarkable. Stomach/Bowel: Stomach is within normal limits. Appendix not identified. No evidence of bowel wall thickening, distention, or inflammatory changes. Vascular/Lymphatic: Aortic atherosclerosis. No enlarged abdominal or pelvic lymph nodes. Reproductive: Prostate is unremarkable. Other: Large volume of peritoneal ascites. Musculoskeletal: No fracture is seen. IMPRESSION: 1. Severe intra and extrahepatic biliary ductal dilatation and distention of the gallbladder. Abrupt tapering of the common bile duct within pancreatic head may represent underlying mass, stricture, or radiolucent stone. 2. Large volume of peritoneal ascites. 3. Ill-defined hypoattenuating lesions within the liver with coarse calcifications measuring  up to 6.8 cm in the right lobe of the liver. Findings of capsular retraction suggest cholangiocarcinoma. 4. When the patient is clinically stable and able to follow directions and hold their breath further evaluation with dedicated abdominal MRI should be considered. Electronically Signed   By: Kristine Garbe M.D.   On: 09/10/2017 03:00   Dg Chest Port 1 View  Result Date: 09/10/2017 CLINICAL DATA:  Abdominal pain and swelling for 2 days EXAM: PORTABLE CHEST 1 VIEW COMPARISON:  None. FINDINGS: The lungs are not optimally aerated, with some elevation of the right hemidiaphragm and mild volume loss at the left lung base. However no definite pneumonia is seen and no pleural effusion is noted. Mediastinal and hilar contours are unremarkable and the heart is within upper limits of normal. No bony abnormality is seen. IMPRESSION: Poor inspiration with mild volume loss at the lung bases. No definite active process. Electronically Signed   By: Ivar Drape M.D.   On: 09/10/2017 08:09    Impression/Plan: - abnormal LFTs with jaundice. Most likely obstructive jaundice. CT scan showing severe intra- Hepatic biliary ductal dilatation with abrupt tapering of the common bile duct at the pancreatic head. - Chronic biliary ductal dilatation dated back to 2009. - coagulopathy. INR 2.45. - Mild elevated troponins. Most likely supply demand mismatch. Patient denies any active chest pain. - acute kidney injury - Right liver lobe lesion of around 6.8 cm. Concerning for cholangiocarcinoma. - Ascites. - Weight loss  Recommendations ------------------------- - Patient is undergoing paracentesis today.  - Recommend MRI MRCP for further evaluation with IV contrast once kidney functions improve. - patient will need ERCP with possible stent placement during this admission. - IV vitamin K. Repeat LFTs, kidney functions and INR in the morning. - continue supportive care for now. - GI will follow.    LOS: 0  days   Otis Brace  MD, FACP 09/10/2017, 10:55 AM  Contact #  [phone]

## 2017-09-10 NOTE — H&P (Signed)
History and Physical    Randy Nielsen QMG:[id number] DOB: 23-Feb-1951 DOA: 09/09/2017  PCP: [name], MD  Patient coming from: Home.  Chief Complaint: Abdominal pain and distention.  HPI: Randy Nielsen is a 67 y.o. male with history of diabetes mellitus, hypertension previous pancreatitis and pancreatic mass has not been to a doctor for many years presents to the ER with complaint of abdominal pain distention which has been worsening over the last 4 days.  Patient also has benign nausea vomiting.  Denies any diarrhea.  Denies any blood in the vomitus.  Has been only getting increasingly jaundiced.  Reviewing the patient's records patient was admitted in 2009 with acute pancreatitis at the time patient was found to have a pancreatic mass and was advised to follow with gastroenterologist which patient has not followed.  ED Course: In the ER patient has tense ascites with distended abdomen.  Labs revealed markedly elevated LFTs with obstructive jaundice.  Patient appears jaundiced and cachectic.  CT of the abdomen and pelvis done shows marketed extra and intrahepatic biliary dilatation with possible stricture or radiolucent stone or pancreatic mass obstructing.  There is also concern for cholangiocarcinoma.  Labs also reveal acute renal failure and thrombocytopenia and anemia.  Review of Systems: As per HPI, rest all negative.   Past Medical History:  Diagnosis Date  . Diabetes mellitus without complication ([place])     History reviewed. No pertinent surgical history.   reports that he has never smoked. He has never used smokeless tobacco. He reports that he drank alcohol. His drug history is not on file.  No Known Allergies  Family History  Problem Relation Age of Onset  . Cancer Neg Hx     Prior to Admission medications   Medication Sig Start Date End Date Taking? Authorizing Provider  naproxen sodium (ALEVE) 220 MG tablet Take 220 mg by mouth 2 (two) times daily as needed  (for pain).   Yes [provider]    Physical Exam: Vitals:   09/10/17 0000 09/10/17 0201 09/10/17 0215 09/10/17 0315  BP: 110/72 105/70 99/66 99/70   Pulse: 71  67 72  Resp: 13 14 12 15   Temp:      TempSrc:      SpO2: 99%  98% 100%      Constitutional: Moderately built and poorly nourished. Vitals:   09/10/17 0000 09/10/17 0201 09/10/17 0215 09/10/17 0315  BP: 110/72 105/70 99/66 99/70   Pulse: 71  67 72  Resp: 13 14 12 15   Temp:      TempSrc:      SpO2: 99%  98% 100%   Eyes: Icterus present no pallor. ENMT: No discharge from the ears eyes nose or mouth. Neck: No mass felt.  No neck rigidity. Respiratory: No rhonchi or crepitations. Cardiovascular: S1-S2 heard no murmurs appreciated. Abdomen: Distended bowel sounds are appreciated no guarding.  No rigidity. Musculoskeletal: Bilateral lower extremity edema. Skin: No rash. Neurologic: Alert awake oriented to time place and person.  Moves all extremities. Psychiatric: Appears normal.  Normal affect.   Labs on Admission: I have personally reviewed following labs and imaging studies  CBC: Recent Labs  Lab 09/09/17 2110  WBC 15.9*  HGB 11.9*  HCT 33.9*  MCV 94.7  PLT 326*   Basic Metabolic Panel: Recent Labs  Lab 09/09/17 2110  NA 136  K 3.7  CL 103  CO2 19*  GLUCOSE 65  BUN 52*  CREATININE 2.36*  CALCIUM 8.2*   GFR: CrCl cannot  be calculated (Unknown ideal weight.). Liver Function Tests: Recent Labs  Lab 09/09/17 2233  AST 152*  ALT 55  ALKPHOS 517*  BILITOT 18.1*  PROT 7.1  ALBUMIN 1.2*   Recent Labs  Lab 09/09/17 2233  LIPASE 810*   No results for input(s): AMMONIA in the last 168 hours. Coagulation Profile: No results for input(s): INR, PROTIME in the last 168 hours. Cardiac Enzymes: No results for input(s): CKTOTAL, CKMB, CKMBINDEX, TROPONINI in the last 168 hours. BNP (last 3 results) No results for input(s): PROBNP in the last 8760 hours. HbA1C: No results for input(s):  HGBA1C in the last 72 hours. CBG: Recent Labs  Lab 09/09/17 2101 09/09/17 2147  GLUCAP 55* 70   Lipid Profile: No results for input(s): CHOL, HDL, LDLCALC, TRIG, CHOLHDL, LDLDIRECT in the last 72 hours. Thyroid Function Tests: No results for input(s): TSH, T4TOTAL, FREET4, T3FREE, THYROIDAB in the last 72 hours. Anemia Panel: No results for input(s): VITAMINB12, FOLATE, FERRITIN, TIBC, IRON, RETICCTPCT in the last 72 hours. Urine analysis:    Component Value Date/Time   COLORURINE AMBER (A) 09/09/2017 0306   APPEARANCEUR HAZY (A) 09/09/2017 0306   LABSPEC 1.020 09/09/2017 0306   PHURINE 5.0 09/09/2017 0306   GLUCOSEU NEGATIVE 09/09/2017 0306   HGBUR NEGATIVE 09/09/2017 0306   BILIRUBINUR MODERATE (A) 09/09/2017 0306   KETONESUR NEGATIVE 09/09/2017 0306   PROTEINUR 30 (A) 09/09/2017 0306   UROBILINOGEN 1.0 11/08/2007 0028   NITRITE NEGATIVE 09/09/2017 0306   LEUKOCYTESUR NEGATIVE 09/09/2017 0306   Sepsis Labs: @LABRCNTIP (procalcitonin:4,lacticidven:4) )No results found for this or any previous visit (from the past 240 hour(s)).   Radiological Exams on Admission: Ct Abdomen Pelvis Wo Contrast  Result Date: 09/10/2017 CLINICAL DATA:  67 y/o M; weakness, abdominal pain, swelling, jaundice, nausea, and not eating. EXAM: CT ABDOMEN AND PELVIS WITHOUT CONTRAST TECHNIQUE: Multidetector CT imaging of the abdomen and pelvis was performed following the standard protocol without IV contrast. COMPARISON:  11/16/2007 CT of abdomen and pelvis FINDINGS: Lower chest: Small left pleural effusion. Hepatobiliary: Severe intra and extrahepatic biliary ductal dilatation and distention of the gallbladder. Small gallstones. The common bile duct is dilated to the pancreatic head where it abruptly tapers (series 6, image 78). Common bile duct measures up to 3.3 cm in diameter. Multiple hypoattenuating pancreatic lesions, several with coarse calcifications, the largest in the right lobe of the liver  peripherally spanning 6.8 x 6.0 cm (series 3, image 21). The large right lobe of liver lesion has a suggestion of capsular retraction. Pancreas: No significant dilatation of the pancreatic duct. No peripancreatic inflammatory change. Spleen: Normal in size without focal abnormality. Adrenals/Urinary Tract: Adrenal glands are unremarkable. Kidneys are normal, without renal calculi, focal lesion, or hydronephrosis. Bladder is unremarkable. Stomach/Bowel: Stomach is within normal limits. Appendix not identified. No evidence of bowel wall thickening, distention, or inflammatory changes. Vascular/Lymphatic: Aortic atherosclerosis. No enlarged abdominal or pelvic lymph nodes. Reproductive: Prostate is unremarkable. Other: Large volume of peritoneal ascites. Musculoskeletal: No fracture is seen. IMPRESSION: 1. Severe intra and extrahepatic biliary ductal dilatation and distention of the gallbladder. Abrupt tapering of the common bile duct within pancreatic head may represent underlying mass, stricture, or radiolucent stone. 2. Large volume of peritoneal ascites. 3. Ill-defined hypoattenuating lesions within the liver with coarse calcifications measuring up to 6.8 cm in the right lobe of the liver. Findings of capsular retraction suggest cholangiocarcinoma. 4. When the patient is clinically stable and able to follow directions and hold their breath further evaluation with dedicated  abdominal MRI should be considered. Electronically Signed   By: Kristine Garbe M.D.   On: 09/10/2017 03:00    EKG: Independently reviewed.  Normal sinus rhythm with anterior infarct age-indeterminate.  Assessment/Plan Principal Problem:   Obstructive jaundice Active Problems:   Abdominal pain   Ascites   ARF (acute renal failure) ([facility])   Diabetes mellitus type 2 in nonobese ([place])    1. Obstructive jaundice with pancreatitis concerning for pancreatic mass versus stricture versus obstructive stone -we will keep patient  n.p.o. empiric antibiotics consult gastroenterology in a.m.  Patient probably will need ERCP.  Follow LFTs.  Acute hepatitis panel.  Patient denies any alcohol abuse recently. 2. Marked ascites -we will get ultrasound-guided paracentesis.  Patient will be on empiric antibiotic coverage for now.  Follow paracentesis lab including cell count differential Gram stain cytology albumin. 3. Acute renal failure -we will check UA.  Closely follow intake output embolic panel likely hepatorenal syndrome. 4. Normocytic normochromic anemia -follow CBC and anemia panel. 5. Thrombocytopenia likely could be from liver disease -follow CBC closely. 6. Possible cholangiocarcinoma seen a CT scan will need further work-up. 7. History of diabetes mellitus -follow CBGs.  Patient states he does take some medication name of which he does not recall. 8. History of hypertension per the chart presently blood pressure is in the low range. 9. Severe protein calorie malnutrition will need nutrition input.  Chest x-ray troponin are pending.   DVT prophylaxis: SCDs. Code Status: Full code. Family Communication: No family at the bedside. Disposition Plan: To be determined. Consults called: Palliative care. Admission status: Inpatient.   Rise Patience MD [facility] Pager [phone].  If 7PM-7AM, please contact night-coverage [email or web address] Password [email or web address]  09/10/2017, 4:02 AM

## 2017-09-10 NOTE — Progress Notes (Signed)
Pharmacy Antibiotic Note  Randy Nielsen is a 67 y.o. male admitted on 09/09/2017 with abdominal pain.  Pharmacy has was consulted for Ceftriaxone dosing for SBP prophylaxis but antibiotic coverage is now being expanded to Zosyn for intra-abdominal coverage. WBC elevated.   Plan: Stop ceftriaxone. Start Zosyn 3.375 gm IV Q 8 hours (EI infusion) Trend WBC, temp F/U infectious work-up  Temp (24hrs), Avg:97.6 F (36.4 C), Min:97.6 F (36.4 C), Max:97.6 F (36.4 C)  Recent Labs  Lab 09/09/17 2110 09/10/17 0418  WBC 15.9* 11.3*  CREATININE 2.36* 2.45*    CrCl cannot be calculated (Unknown ideal weight.).    No Known Allergies  Albertina Parr, PharmD., BCPS Clinical Pharmacist Clinical phone for 09/10/17 until 3:30pm: [phone] If after 3:30pm, please call main pharmacy at: [phone]

## 2017-09-10 NOTE — Progress Notes (Signed)
Patient seen and examined at bedside this morning, patient admitted after midnight, please see earlier detailed admission note by Rise Patience, MD. Briefly, patient presented with abdominal pain and distention found to have evidence of obstructive jaundice and associated ascites. He has been started on empiric antibiotics. GI consulted this morning and planning for MRCP and ERCP. Paracentesis ordered and is pending. Repeat labs in the AM. Gi recommendations for further management.   Cordelia Poche, MD [facility] 09/10/2017, 2:20 PM Pager: [phone]

## 2017-09-10 NOTE — Procedures (Signed)
Ultrasound-guided diagnostic and therapeutic paracentesis performed yielding 2.5 liters of golden yellow fluid. No immediate complications. The fluid was sent to the lab for preordered studies.

## 2017-09-10 NOTE — Consult Note (Signed)
Consultation Note Date: 09/10/2017   Patient Name: Randy Nielsen  DOB: [birth year]  MRN: [id number]  Age / Sex: 67 y.o., male  PCP: [name], MD Referring Physician: Rise Patience, MD  Reason for Consultation: Establishing goals of care  HPI/Patient Profile: 67 y.o. male  with past medical history of DM, HTN, pancreatitis, and pancreatic mass admitted on 09/09/2017 with abdominal pain, distention, nausea, and vomiting. Labs revealed markedly elevated LFTs with obstructive jaundice. CT abd reveals severe intra and extrahepatic biliary ductal dilatation and distention of the gallbladder, possible underlying mass, stricture, or stone, large volume of peritoneal ascites, and lesions within the liver. 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  PMT consulted for [place].   Clinical Assessment and Goals of Care: I have reviewed medical records including EPIC notes, labs and imaging, assessed the patient and then met at the bedside along with patient's significant other, Randy Nielsen,  to discuss diagnosis prognosis, GOC, EOL wishes, disposition and options.  I introduced Palliative Medicine as specialized medical care for people living with serious illness. It focuses on providing relief from the symptoms and stress of a serious illness. The goal is to improve quality of life for both the patient and the family.  We discussed a brief life review of the patient. 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 He tells me he has good support with Randy Nielsen and his son, Randy Nielsen.   As far as functional and nutritional status, Randy Nielsen tells me patient has spent most of his time lying around and not eating for the past several weeks. Prior to this, she tells me "he wasn't doing well" but he would go out of the house from time to time.    We discussed his current illness and what it means in the larger context of his on-going co-morbidities. We  discussed that they want answers from ERCP, MRCP, and paracentesis fluid. Randy Nielsen seemed to want additional information about potential causes of patient's symptoms, but Mr. Bady asked that I not discuss it any further until they receive more answers.  Patient tells me he would want Randy Nielsen to be decision maker if he were unable.   This meeting was largely to build rapport, introduce palliative medicine and offer support for future decisions. Patient and significant other thankful for visit and support.   Questions and concerns were addressed. The family was encouraged to call with questions or concerns.   Primary Decision Maker PATIENT    SUMMARY OF RECOMMENDATIONS   -Rapport building meeting - Patient and family are waiting further work up before they are able to discuss [place] - Spiritual care consult placed as patient would want Randy Nielsen to be HCPOA - PMT will continue to meet with patient - Code status not addressed today - will address in future  Code Status/Advance Care Planning:  Full code   Symptom Management:   Patient tells me he is currently comfortable and his symptoms were relieved by paracentesis  Palliative Prophylaxis:   Aspiration, Bowel Regimen, Delirium Protocol, Frequent Pain Assessment, Oral Care and Turn Reposition  Psycho-social/Spiritual:   Desire for further Chaplaincy support:yes - HCPOA  Prognosis:   Unable to determine  Discharge Planning: To Be Determined      Primary Diagnoses: Present on Admission: . Abdominal pain . Obstructive jaundice . Ascites . ARF (acute renal failure) ([place])   I have reviewed the medical record, interviewed the patient and family, and examined the patient. The following aspects are pertinent.  Past Medical History:  Diagnosis Date  . Diabetes mellitus without complication [facility])    Social History   Socioeconomic History  . Marital status: Legally Separated    Spouse name: Not on file  . Number of children: Not on  file  . Years of education: Not on file  . Highest education level: Not on file  Occupational History  . Not on file  Social Needs  . Financial resource strain: Not on file  . Food insecurity:    Worry: Not on file    Inability: Not on file  . Transportation needs:    Medical: Not on file    Non-medical: Not on file  Tobacco Use  . Smoking status: Never Smoker  . Smokeless tobacco: Never Used  Substance and Sexual Activity  . Alcohol use: Not Currently  . Drug use: Not on file  . Sexual activity: Not on file  Lifestyle  . Physical activity:    Days per week: Not on file    Minutes per session: Not on file  . Stress: Not on file  Relationships  . Social connections:    Talks on phone: Not on file    Gets together: Not on file    Attends religious service: Not on file    Active member of club or organization: Not on file    Attends meetings of clubs or organizations: Not on file    Relationship status: Not on file  Other Topics Concern  . Not on file  Social History Narrative  . Not on file   Family History  Problem Relation Age of Onset  . Cancer Neg Hx    Scheduled Meds: Continuous Infusions: . piperacillin-tazobactam (ZOSYN)  IV     PRN Meds:.acetaminophen **OR** acetaminophen, ondansetron **OR** ondansetron (ZOFRAN) IV No Known Allergies Review of Systems  Constitutional: Positive for activity change, appetite change, fatigue and unexpected weight change.  Respiratory: Negative for shortness of breath.   Gastrointestinal: Positive for abdominal distention, diarrhea, nausea and vomiting.  Neurological: Positive for weakness.    Physical Exam  Constitutional: He is oriented to person, place, and time. No distress.  HENT:  Head: Normocephalic and atraumatic.  Cardiovascular: Normal rate and regular rhythm.  Pulmonary/Chest: Breath sounds normal. No accessory muscle usage. No tachypnea. No respiratory distress.  Abdominal: He exhibits distension. There is no  tenderness.  Musculoskeletal:       Right lower leg: He exhibits edema.       Left lower leg: He exhibits edema.  Neurological: He is alert and oriented to person, place, and time.  weak  Skin: Skin is warm and dry. He is not diaphoretic.  Psychiatric: Cognition and memory are not impaired.    Vital Signs: BP 111/64   Pulse (!) 129   Temp 97.6 F (36.4 C) (Oral)   Resp 14   Ht '5\' 6"'$  (1.676 m)   Wt 65.4 kg (144 lb 2.9 oz)   SpO2 98%   BMI 23.27 kg/m  Pain Scale: 0-10   Pain Score: 0-No pain   SpO2: SpO2: 98 % O2 Device:SpO2: 98 % O2 Flow Rate: .   IO: Intake/output summary:   Intake/Output Summary (Last 24 hours) at 09/10/2017 1020 Last data filed at 09/10/2017 1607 Gross per 24 hour  Intake 2100 ml  Output -  Net 2100 ml    LBM: Last BM Date: 09/10/17 Baseline Weight: Weight: 65.4 kg (144 lb 2.9 oz) Most recent weight: Weight: 65.4 kg (144 lb 2.9 oz)  Palliative Assessment/Data: PPS 50%     Time In: 1330 Time Out: 1430 Time Total: 60 minutes Greater than 50%  of this time was spent counseling and coordinating care related to the above assessment and plan.  Juel Burrow, DNP, AGNP-C Palliative Medicine Team [phone]

## 2017-09-10 NOTE — Progress Notes (Signed)
CRITICAL VALUE ALERT  Critical Value:  Troponin 0.30  Date & Time Notied:  09/10/2017 and 1040  Provider Notified: Lonny Prude  Orders Received/Actions taken: Will wait for orders.

## 2017-09-10 NOTE — Progress Notes (Signed)
Pharmacy Antibiotic Note  Randy Nielsen is a 67 y.o. male admitted on 09/09/2017 with abdominal pain.  Pharmacy has been consulted for Ceftriaxone dosing for SBP prophylaxis. WBC elevated.   Plan: Ceftriaxone 2g IV q24h  Trend WBC, temp F/U infectious work-up  Temp (24hrs), Avg:97.6 F (36.4 C), Min:97.6 F (36.4 C), Max:97.6 F (36.4 C)  Recent Labs  Lab 09/09/17 2110  WBC 15.9*  CREATININE 2.36*    CrCl cannot be calculated (Unknown ideal weight.).    No Known Allergies  Narda Bonds 09/10/2017 4:17 AM

## 2017-09-11 ENCOUNTER — Inpatient Hospital Stay (HOSPITAL_COMMUNITY): Payer: Medicare Other

## 2017-09-11 DIAGNOSIS — K838 Other specified diseases of biliary tract: Secondary | ICD-10-CM

## 2017-09-11 LAB — COMPREHENSIVE METABOLIC PANEL
ALBUMIN: 1.4 g/dL — AB (ref 3.5–5.0)
ALK PHOS: 454 U/L — AB (ref 38–126)
ALT: 49 U/L (ref 17–63)
ANION GAP: 13 (ref 5–15)
AST: 127 U/L — AB (ref 15–41)
BUN: 55 mg/dL — AB (ref 6–20)
CALCIUM: 7.8 mg/dL — AB (ref 8.9–10.3)
CO2: 16 mmol/L — AB (ref 22–32)
Chloride: 108 mmol/L (ref 101–111)
Creatinine, Ser: 2.46 mg/dL — ABNORMAL HIGH (ref 0.61–1.24)
GFR calc Af Amer: 30 mL/min — ABNORMAL LOW (ref >60)
GFR calc non Af Amer: 26 mL/min — ABNORMAL LOW (ref >60)
GLUCOSE: 71 mg/dL (ref 65–99)
Potassium: 3.3 mmol/L — ABNORMAL LOW (ref 3.5–5.1)
SODIUM: 137 mmol/L (ref 135–145)
Total Bilirubin: 18.5 mg/dL (ref 0.3–1.2)
Total Protein: 5.8 g/dL — ABNORMAL LOW (ref 6.5–8.1)

## 2017-09-11 LAB — PROTIME-INR
INR: 1.59
Prothrombin Time: 18.8 seconds — ABNORMAL HIGH (ref 11.4–15.2)

## 2017-09-11 LAB — GLUCOSE, CAPILLARY
GLUCOSE-CAPILLARY: 58 mg/dL — AB (ref 65–99)
GLUCOSE-CAPILLARY: 75 mg/dL (ref 65–99)
GLUCOSE-CAPILLARY: 77 mg/dL (ref 65–99)
GLUCOSE-CAPILLARY: 84 mg/dL (ref 65–99)
GLUCOSE-CAPILLARY: 99 mg/dL (ref 65–99)
Glucose-Capillary: 66 mg/dL (ref 65–99)
Glucose-Capillary: 84 mg/dL (ref 65–99)

## 2017-09-11 LAB — HEPATITIS PANEL, ACUTE
HCV AB: 1.2 {s_co_ratio} — AB (ref 0.0–0.9)
HEP A IGM: NEGATIVE
HEP B C IGM: NEGATIVE
HEP B S AG: NEGATIVE

## 2017-09-11 LAB — TROPONIN I: TROPONIN I: 0.08 ng/mL — AB (ref <0.03)

## 2017-09-11 MED ORDER — SODIUM BICARBONATE 8.4 % IV SOLN
INTRAVENOUS | Status: DC
  Administered 2017-09-11 – 2017-09-14 (×5): via INTRAVENOUS
  Filled 2017-09-11 (×6): qty 100

## 2017-09-11 MED ORDER — ALBUMIN HUMAN 25 % IV SOLN
100.0000 g | Freq: Once | INTRAVENOUS | Status: AC
  Administered 2017-09-11: 100 g via INTRAVENOUS
  Filled 2017-09-11: qty 400

## 2017-09-11 NOTE — Progress Notes (Signed)
NP requested  AD materials for patient.  Left materials and instructed patient to call for chaplain before 3 to have witnesses and notary to sign.Conard Novak, Chaplain   09/11/17 1500  Clinical Encounter Type  Visited With Patient;Health care provider  Visit Type Initial;Other (Comment) (AD materials)  Referral From Physician  Consult/Referral To Chaplain  Spiritual Encounters  Spiritual Needs Literature

## 2017-09-11 NOTE — Progress Notes (Signed)
Daily Progress Note   Patient Name: Randy Nielsen       Date: 09/11/2017 DOB: Oct 26, 1950  Age: 67 y.o. MRN#: [id number] Attending Physician: Roxan Hockey, MD Primary Care Physician: [name], MD Admit Date: 09/09/2017  Reason for Consultation/Follow-up: Establishing goals of care  Subjective: Tells me feels better, no abd pain or nausea, tolerating clear liquids  Length of Stay: 1  Current Medications: Scheduled Meds:    Continuous Infusions: . piperacillin-tazobactam (ZOSYN)  IV 3.375 g (09/11/17 1000)  .  sodium bicarbonate  infusion 1000 mL 75 mL/hr at 09/11/17 1012    PRN Meds: acetaminophen **OR** acetaminophen, lidocaine, ondansetron **OR** ondansetron (ZOFRAN) IV  Physical Exam  Constitutional: He is oriented to person, place, and time. No distress.  HENT:  Head: Normocephalic and atraumatic.  Cardiovascular: Normal rate and regular rhythm.  Pulmonary/Chest: Effort normal and breath sounds normal. No respiratory distress.  Abdominal: He exhibits distension. There is no tenderness.  Neurological: He is alert and oriented to person, place, and time.  Skin: Skin is warm and dry. He is not diaphoretic.            Vital Signs: BP 90/63 (BP Location: Right Arm)   Pulse 67   Temp 97.9 F (36.6 C)   Resp 12   Ht 5\' 6"  (1.676 m)   Wt 63.8 kg (140 lb 10.5 oz)   SpO2 100%   BMI 22.70 kg/m  SpO2: SpO2: 100 % O2 Device: O2 Device: Nasal Cannula O2 Flow Rate: O2 Flow Rate (L/min): 2 L/min  Intake/output summary:   Intake/Output Summary (Last 24 hours) at 09/11/2017 1022 Last data filed at 09/11/2017 5400 Gross per 24 hour  Intake 750 ml  Output 2600 ml  Net -1850 ml   LBM: Last BM Date: 09/10/17 Baseline Weight: Weight: 65.4 kg (144 lb 2.9 oz) Most recent weight: Weight:  63.8 kg (140 lb 10.5 oz)       Palliative Assessment/Data: PPS 50%    Flowsheet Rows     Most Recent Value  Intake Tab  Referral Department  Hospitalist  Unit at Time of Referral  Cardiac/Telemetry Unit  Palliative Care Primary Diagnosis  Other (Comment) [possible cholangiocarcinoma]  Date Notified  09/10/17  Palliative Care Type  New Palliative care  Reason for referral  Clarify Goals of Care  Date of Admission  09/09/17  Date first seen by Palliative Care  09/10/17  # of days Palliative referral response time  0 Day(s)  # of days IP prior to Palliative referral  1  Clinical Assessment  Palliative Performance Scale Score  50%  Psychosocial & Spiritual Assessment  Palliative Care Outcomes  Patient/Family meeting held?  Yes  Who was at the meeting?  patient and sig other, [place] Outcomes  Provided psychosocial or spiritual support      Patient Active Problem List   Diagnosis Date Noted  . Abdominal pain 09/10/2017  . Obstructive jaundice 09/10/2017  . Ascites 09/10/2017  . ARF (acute renal failure) ([place]) 09/10/2017  . Diabetes mellitus type 2 in nonobese ([place]) 09/10/2017  . Abdominal mass   . Goals of care, counseling/discussion   . Palliative care by specialist  Palliative Care Assessment & Plan   HPI: 67 y.o. male  with past medical history of DM, HTN, pancreatitis, and pancreatic mass admitted on 09/09/2017 with abdominal pain, distention, nausea, and vomiting.Labs revealed markedly elevated LFTs with obstructive jaundice. CT abd reveals severe intra and extrahepatic biliary ductal dilatation and distention of the gallbladder, possible underlying mass, stricture, or stone, large volume of peritoneal ascites, and lesions within the liver. 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  PMT consulted for [place].   Assessment: Follow up with patient - he continues to feel better, denies pain or nausea. Up and walking around room during my visit. Sig  other, [name], with him and present for meeting. He tells me that he is unable to have MRCP until kidney function improves. We discussed need to further discuss [place]. [name] tells me she would like [name] (patient's son) to be present for this meeting; patient agrees - Psychologist, counselling provided. [name] did not answer and does not have voicemail set-up.  Will continue to reach out.  Recommendations/Plan: - Will continue to reach out to [facility] to arrange family meeting - Patient and family are waiting further work up before discussing [place] - Spiritual care consult placed as patient would want [name] to be HCPOA - PMT will continue to meet with patient - Code status not addressed today - will address in future  Goals of Care and Additional Recommendations:  Limitations on Scope of Treatment: Full Scope Treatment  Code Status:  Full code  Prognosis:   Unable to determine  Discharge Planning:  To Be Determined  Care plan was discussed with patient and sig other, [name]  Thank you for allowing the Palliative Medicine Team to assist in the care of this patient.   Time In: 0900 Time Out: 0930 Total Time 30 minutes Prolonged Time Billed  no       Greater than 50%  of this time was spent counseling and coordinating care related to the above assessment and plan.  Juel Burrow, DNP, AGNP-C Palliative Medicine Team Team Phone # [phone]

## 2017-09-11 NOTE — Progress Notes (Signed)
Patient Demographics:    Randy Nielsen, is a 67 y.o. male, DOB - 02-Apr-1951, [id number]  Admit date - 09/09/2017   Admitting Physician Rise Patience, MD  Outpatient Primary MD for the patient is [name], MD  LOS - 1   Chief Complaint  Patient presents with  . Weakness        Subjective:    Randy Nielsen today has no fevers, no emesis,  No chest pain, male friend at bedside, questions answered, had BM 09/20/17  Assessment  & Plan :    Principal Problem:   Obstructive jaundice Active Problems:   Abdominal pain   Ascites   ARF (acute renal failure) (HCC)   Diabetes mellitus type 2 in nonobese [facility])   Abdominal mass   Goals of care, counseling/discussion   Palliative care by specialist  Brief Summary:- 67 y.o. male with history of diabetes mellitus, hypertension previous pancreatitis and pancreatic mass has not been to a doctor for many years admitted on 09/10/17 with obstructive jaundice after presenting with abdominal pain and distention .  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  Trend LFTs, acute hepatitis panel pending, patient denies recent alcohol use  2)Ascites-status post ultrasound-guided pressure transfusion on 09/10/2017 with 2.5 L of yellow fluid removed, await studies including Gram stain and culture, continue IV Zosyn started on 09/10/2017  3)ARF-acute renal failure with acidosis, start bicarb drip, ???  Hepatorenal syndrome, creatinine is still above 2  4)  anemia/thrombocytopenia-suspect this is related to underlying liver disease, no evidence of ongoing bleeding, monitor closely and transfuse as clinically indicated  5) severe protein caloric malnutrition-once acute GI issues improved patient will need increased nutritional intake including supplements, dietary consultation will be needed  6)Social/Ethics-palliative care consult requested, Care conference meeting with palliative team on 09/12/2017 at 9 AM to help delineate goals of care and outline advanced directives  7)DM- A1c is 5.2, Allow some permissive Hyperglycemia rather than risk life-threatening hypoglycemia in a patient with unreliable oral intake. Use Novolog/Humalog Sliding scale insulin with Accu-Cheks/Fingersticks as ordered  Code Status : Full   Disposition Plan  : TBD  Consults  :  Gi/palliative care/IR  DVT Prophylaxis  :   - SCDs (thrombocytopenia)  Lab Results  Component Value Date   PLT 114 (L) 09/10/2017    Inpatient Medications  Scheduled Meds: Continuous Infusions: . piperacillin-tazobactam (ZOSYN)  IV 3.375 g (09/11/17 1739)  .  sodium bicarbonate  infusion 1000 mL 75 mL/hr at 09/11/17 1012   PRN Meds:.acetaminophen **OR** acetaminophen, lidocaine, ondansetron **OR** ondansetron (ZOFRAN) IV    Anti-infectives (From admission, onward)   Start     Dose/Rate Route Frequency Ordered Stop   09/10/17 0800  piperacillin-tazobactam (ZOSYN) IVPB 3.375 g     3.375 g 12.5 mL/hr over 240 Minutes Intravenous Every 8 hours 09/10/17 0739     09/10/17 0430  cefTRIAXone (ROCEPHIN) 2 g  in sodium chloride 0.9 % 100 mL IVPB  Status:  Discontinued     2 g 200 mL/hr over 30 Minutes Intravenous Every 24 hours 09/10/17 0419 09/10/17 0739        Objective:   Vitals:   09/11/17 0045 09/11/17 0425 09/11/17 1242 09/11/17 1734  BP: (!) 70/58 90/63 (!) 79/49 96/63  Pulse: 66 67 66 75  Resp: 12 12 16 19   Temp: 97.9 F (36.6 C) 97.9 F (36.6 C)  98.1 F (36.7 C)  TempSrc:     Oral  SpO2: (!) 78% 100% 100% 95%  Weight:  63.8 kg (140 lb 10.5 oz)    Height:        Wt Readings from Last 3 Encounters:  09/11/17 63.8 kg (140 lb 10.5 oz)     Intake/Output Summary (Last 24 hours) at 09/11/2017 1811 Last data filed at 09/11/2017 0623 Gross per 24 hour  Intake 700 ml  Output -  Net 700 ml     Physical Exam  Gen:- Awake Alert, frail and chronically ill-appearing HEENT:- [place].AT, +ve sclera icterus Neck-Supple Neck,No JVD,.  Lungs-  CTAB , fair air movement CV- S1, S2 normal Abd-  +ve B.Sounds, abdominal distention, not firm, Extremity/Skin:- No  edema,   good pulses Psych-affect is appropriate, oriented x3 Neuro-no new focal deficits, generalized weakness   Data Review:   Micro Results Recent Results (from the past 240 hour(s))  Body fluid culture     Status: None (Preliminary result)   Collection Time: 09/10/17 11:42 AM  Result Value Ref Range Status   Specimen Description PERITONEAL CAVITY  Final   Special Requests NONE  Final   Gram Stain   Final    RARE WBC PRESENT, PREDOMINANTLY MONONUCLEAR NO ORGANISMS SEEN    Culture   Final    NO GROWTH < 24 HOURS Performed at [facility], [address] [place], [place] [address]    Report Status PENDING  Incomplete    Radiology Reports Ct Abdomen Pelvis Wo Contrast  Result Date: 09/10/2017 CLINICAL DATA:  67 y/o M; weakness, abdominal pain, swelling, jaundice, nausea, and not eating. EXAM: CT ABDOMEN AND PELVIS WITHOUT CONTRAST TECHNIQUE: Multidetector CT imaging of the abdomen and pelvis was performed following the standard protocol without IV contrast. COMPARISON:  11/16/2007 CT of abdomen and pelvis FINDINGS: Lower chest: Small left pleural effusion. Hepatobiliary: Severe intra and extrahepatic biliary ductal dilatation and distention of the gallbladder. Small gallstones. The common bile duct is dilated to the pancreatic head where it abruptly tapers (series 6, image 78). Common bile duct  measures up to 3.3 cm in diameter. Multiple hypoattenuating pancreatic lesions, several with coarse calcifications, the largest in the right lobe of the liver peripherally spanning 6.8 x 6.0 cm (series 3, image 21). The large right lobe of liver lesion has a suggestion of capsular retraction. Pancreas: No significant dilatation of the pancreatic duct. No peripancreatic inflammatory change. Spleen: Normal in size without focal abnormality. Adrenals/Urinary Tract: Adrenal glands are unremarkable. Kidneys are normal, without renal calculi, focal lesion, or hydronephrosis. Bladder is unremarkable. Stomach/Bowel: Stomach is within normal limits. Appendix not identified. No evidence of bowel wall thickening, distention, or inflammatory changes. Vascular/Lymphatic: Aortic atherosclerosis. No enlarged abdominal or pelvic lymph nodes. Reproductive: Prostate is unremarkable. Other: Large volume of peritoneal ascites. Musculoskeletal: No fracture is seen. IMPRESSION: 1. Severe intra and extrahepatic biliary ductal dilatation and distention of the gallbladder. Abrupt tapering of the common bile duct within pancreatic head may represent underlying  mass, stricture, or radiolucent stone. 2. Large volume of peritoneal ascites. 3. Ill-defined hypoattenuating lesions within the liver with coarse calcifications measuring up to 6.8 cm in the right lobe of the liver. Findings of capsular retraction suggest cholangiocarcinoma. 4. When the patient is clinically stable and able to follow directions and hold their breath further evaluation with dedicated abdominal MRI should be considered. Electronically Signed   By: Kristine Garbe M.D.   On: 09/10/2017 03:00   Dg Chest Port 1 View  Result Date: 09/10/2017 CLINICAL DATA:  Abdominal pain and swelling for 2 days EXAM: PORTABLE CHEST 1 VIEW COMPARISON:  None. FINDINGS: The lungs are not optimally aerated, with some elevation of the right hemidiaphragm and mild volume loss at the  left lung base. However no definite pneumonia is seen and no pleural effusion is noted. Mediastinal and hilar contours are unremarkable and the heart is within upper limits of normal. No bony abnormality is seen. IMPRESSION: Poor inspiration with mild volume loss at the lung bases. No definite active process. Electronically Signed   By: Ivar Drape M.D.   On: 09/10/2017 08:09   Ir Paracentesis  Result Date: 09/10/2017 INDICATION: Patient with history of abdominal pain, jaundice, elevated liver function tests/creatinine, intra/extrahepatic biliary ductal dilatation, liver lesions, ascites. Request made for diagnostic and therapeutic paracentesis. EXAM: ULTRASOUND GUIDED DIAGNOSTIC AND THERAPEUTIC PARACENTESIS MEDICATIONS: None COMPLICATIONS: None immediate. PROCEDURE: Informed written consent was obtained from the patient after a discussion of the risks, benefits and alternatives to treatment. A timeout was performed prior to the initiation of the procedure. Initial ultrasound scanning demonstrates a moderate amount of ascites within the left lower abdominal quadrant. The left lower abdomen was prepped and draped in the usual sterile fashion. 2% lidocaine was used for local anesthesia. Following this, a 6 Fr Safe-T-Centesis catheter was introduced. An ultrasound image was saved for documentation purposes. The paracentesis was performed. The catheter was removed and a dressing was applied. The patient tolerated the procedure well without immediate post procedural complication. FINDINGS: A total of approximately 2.5 liters of golden yellow fluid was removed. Samples were sent to the laboratory as requested by the clinical team. IMPRESSION: Successful ultrasound-guided diagnostic and therapeutic paracentesis yielding 2.5 liters of peritoneal fluid. Read by: Rowe Robert, PA-C Electronically Signed   By: Jerilynn Mages.  Shick M.D.   On: 09/10/2017 12:05     CBC Recent Labs  Lab 09/09/17 2110 09/10/17 0418  WBC 15.9* 11.3*   HGB 11.9* 10.5*  HCT 33.9* 30.4*  PLT 136* 114*  MCV 94.7 95.0  MCH 33.2 32.8  MCHC 35.1 34.5  RDW 16.7* 16.9*  LYMPHSABS  --  1.8  MONOABS  --  0.5  EOSABS  --  0.0  BASOSABS  --  0.0    Chemistries  Recent Labs  Lab 09/09/17 2110 09/09/17 2233 09/10/17 0418 09/11/17 0448  NA 136  --  136 137  K 3.7  --  3.6 3.3*  CL 103  --  106 108  CO2 19*  --  17* 16*  GLUCOSE 65  --  68 71  BUN 52*  --  51* 55*  CREATININE 2.36*  --  2.45* 2.46*  CALCIUM 8.2*  --  7.8* 7.8*  AST  --  152* 143* 127*  ALT  --  55 49 49  ALKPHOS  --  517* 483* 454*  BILITOT  --  18.1* 16.7* 18.5*   ------------------------------------------------------------------------------------------------------------------ No results for input(s): CHOL, HDL, LDLCALC, TRIG, CHOLHDL, LDLDIRECT in the  last 72 hours.  Lab Results  Component Value Date   HGBA1C 5.2 09/10/2017   ------------------------------------------------------------------------------------------------------------------ No results for input(s): TSH, T4TOTAL, T3FREE, THYROIDAB in the last 72 hours.  Invalid input(s): FREET3 ------------------------------------------------------------------------------------------------------------------ Recent Labs    09/10/17 0418  VITAMINB12 >7,500*  FOLATE 7.4  FERRITIN 1,819*  TIBC NOT CALCULATED  IRON 26*  RETICCTPCT 1.7    Coagulation profile Recent Labs  Lab 09/10/17 0313 09/11/17 0448  INR 2.45 1.59    No results for input(s): DDIMER in the last 72 hours.  Cardiac Enzymes Recent Labs  Lab 09/10/17 0847 09/11/17 0448  TROPONINI 0.30* 0.08*   ------------------------------------------------------------------------------------------------------------------ No results found for: BNP   Roxan Hockey M.D on 09/11/2017 at 6:11 PM  Between 7am to 7pm - Pager - [phone]  After 7pm go to [email or web address] - password [email or web address]  [facility] -  Office  [phone]   Voice  Recognition Viviann Spare dictation system was used to create this note, attempts have been made to correct errors. Please contact the author with questions and/or clarifications.

## 2017-09-11 NOTE — Progress Notes (Signed)
[facility] Gastroenterology Progress Note  Randy Nielsen 67 y.o. 03-01-1951  CC:  jaundice   Subjective: Complaining of fatigue and weakness. Denied any bleeding. Generalized abdominal discomfort. Denied any nausea or vomiting.  ROS : negative for active chest pain. Positive for fatigue weakness. Positive for weight loss.   Objective: Vital signs in last 24 hours: Vitals:   09/11/17 0045 09/11/17 0425  BP: (!) 70/58 90/63  Pulse: 66 67  Resp: 12 12  Temp: 97.9 F (36.6 C) 97.9 F (36.6 C)  SpO2: (!) 78% 100%    Physical Exam:  Constitutional: He is oriented to person, place, and time. No distress.  Cachectic-appearing patient. Not in acute distress  HENT:  Head: Normocephalic and atraumatic.  Mouth/Throat: Oropharynx is clear and moist. No oropharyngeal exudate.  Eyes: EOM are normal. Scleral icterus is present.  Neck: Normal range of motion. Neck supple.  Cardiovascular: Normal rate and regular rhythm.  Murmur heard. Pulmonary/Chest: Effort normal and breath sounds normal. No respiratory distress.  Abdominal: Bowel sounds are normal. He exhibits distension. There is no rebound and no guarding.  Significantly distended abdomen with generalized discomfort on palpation  Musculoskeletal: Normal range of motion.  No edema      Lab Results: Recent Labs    09/10/17 0418 09/11/17 0448  NA 136 137  K 3.6 3.3*  CL 106 108  CO2 17* 16*  GLUCOSE 68 71  BUN 51* 55*  CREATININE 2.45* 2.46*  CALCIUM 7.8* 7.8*   Recent Labs    09/10/17 0418 09/11/17 0448  AST 143* 127*  ALT 49 49  ALKPHOS 483* 454*  BILITOT 16.7* 18.5*  PROT 6.4* 5.8*  ALBUMIN 1.1* 1.4*   Recent Labs    09/09/17 2110 09/10/17 0418  WBC 15.9* 11.3*  NEUTROABS  --  9.0*  HGB 11.9* 10.5*  HCT 33.9* 30.4*  MCV 94.7 95.0  PLT 136* 114*   Recent Labs    09/10/17 0313 09/11/17 0448  LABPROT 26.4* 18.8*  INR 2.45 1.59      Assessment/Plan: - abnormal LFTs with jaundice.  CT scan showing  severe biliary ductal dilatation with abrupt tapering of the common bile duct at the pancreatic head. Chronic biliary ductal dilatation dated back to 2009.  - clinical picture concerning for cirrhosis. MELD 34. Ascites with low  fluid albumin. Low serum albumin. Thrombocytopenia. Elevated INR, - coagulopathy. INR 2.45 on presentation.improving. INR 1.59 now. - Mild elevated troponins. Most likely supply demand mismatch. Patient denies any active chest pain. - acute kidney injury. ?? HRS  - Right liver lobe lesion of around 6.8 cm. Concerning for cholangiocarcinoma. - Ascites.negative for SBP.  fluid albumin less than 1. - Weight loss - positive hepatitis C antibody.  Recommendations ------------------------ - there was initial concern for obstructive jaundice but  patient's presentation now is concerning for decompensated liver disease.patient with recent increase alcohol use. Positive hepatitis C antibody. Ascites with low  fluid albumin. Low serum albumin. Thrombocytopenia. Elevated INR, - right lobe lesion could be hepatocellular carcinoma. Check AFP, check CA-19-9. HCV PCR  - Start IV albumin for possible hepatorenal syndrome. - MRCP without contrast to rule out biliary obstruction. - Prognosis poor. Palliative care following. GI will follow.  Otis Brace MD, [place] 09/11/2017, 11:45 AM  Contact #  [phone]

## 2017-09-11 NOTE — Progress Notes (Signed)
Palliative care:   Planning for meeting at 9 am tomorrow, 09/12/17 w/patient, sig other [name], and son, Gemma Payor Charyl Bigger, DNP, [facility] Palliative Medicine Team Team Phone # [phone]

## 2017-09-12 DIAGNOSIS — R7989 Other specified abnormal findings of blood chemistry: Secondary | ICD-10-CM

## 2017-09-12 DIAGNOSIS — R945 Abnormal results of liver function studies: Secondary | ICD-10-CM

## 2017-09-12 LAB — CANCER ANTIGEN 19-9: CAN 19-9: 7456 U/mL — AB (ref 0–35)

## 2017-09-12 LAB — GLUCOSE, CAPILLARY
GLUCOSE-CAPILLARY: 86 mg/dL (ref 65–99)
GLUCOSE-CAPILLARY: 95 mg/dL (ref 65–99)
Glucose-Capillary: 125 mg/dL — ABNORMAL HIGH (ref 65–99)
Glucose-Capillary: 78 mg/dL (ref 65–99)
Glucose-Capillary: 87 mg/dL (ref 65–99)
Glucose-Capillary: 92 mg/dL (ref 65–99)

## 2017-09-12 LAB — COMPREHENSIVE METABOLIC PANEL
ALBUMIN: 2.2 g/dL — AB (ref 3.5–5.0)
ALT: 42 U/L (ref 17–63)
AST: 96 U/L — AB (ref 15–41)
Alkaline Phosphatase: 330 U/L — ABNORMAL HIGH (ref 38–126)
Anion gap: 9 (ref 5–15)
BILIRUBIN TOTAL: 20.5 mg/dL — AB (ref 0.3–1.2)
BUN: 38 mg/dL — AB (ref 6–20)
CHLORIDE: 107 mmol/L (ref 101–111)
CO2: 21 mmol/L — AB (ref 22–32)
Calcium: 8 mg/dL — ABNORMAL LOW (ref 8.9–10.3)
Creatinine, Ser: 1.75 mg/dL — ABNORMAL HIGH (ref 0.61–1.24)
GFR calc Af Amer: 45 mL/min — ABNORMAL LOW (ref >60)
GFR calc non Af Amer: 39 mL/min — ABNORMAL LOW (ref >60)
GLUCOSE: 92 mg/dL (ref 65–99)
POTASSIUM: 2.6 mmol/L — AB (ref 3.5–5.1)
SODIUM: 137 mmol/L (ref 135–145)
Total Protein: 5.6 g/dL — ABNORMAL LOW (ref 6.5–8.1)

## 2017-09-12 LAB — MAGNESIUM: Magnesium: 1.9 mg/dL (ref 1.7–2.4)

## 2017-09-12 LAB — AFP TUMOR MARKER: AFP, SERUM, TUMOR MARKER: 1 ng/mL (ref 0.0–8.3)

## 2017-09-12 MED ORDER — POTASSIUM CHLORIDE CRYS ER 20 MEQ PO TBCR
40.0000 meq | EXTENDED_RELEASE_TABLET | Freq: Once | ORAL | Status: AC
  Administered 2017-09-12: 40 meq via ORAL
  Filled 2017-09-12: qty 2

## 2017-09-12 MED ORDER — POTASSIUM CHLORIDE 10 MEQ/100ML IV SOLN
10.0000 meq | INTRAVENOUS | Status: DC
  Administered 2017-09-12 (×3): 10 meq via INTRAVENOUS
  Filled 2017-09-12 (×3): qty 100

## 2017-09-12 MED ORDER — POTASSIUM CHLORIDE 10 MEQ/100ML IV SOLN
10.0000 meq | INTRAVENOUS | Status: AC
  Administered 2017-09-12 (×2): 10 meq via INTRAVENOUS
  Filled 2017-09-12 (×2): qty 100

## 2017-09-12 MED ORDER — PIPERACILLIN-TAZOBACTAM 3.375 G IVPB
3.3750 g | Freq: Three times a day (TID) | INTRAVENOUS | Status: DC
  Administered 2017-09-12 – 2017-09-16 (×11): 3.375 g via INTRAVENOUS
  Filled 2017-09-12 (×12): qty 50

## 2017-09-12 NOTE — Plan of Care (Signed)
  Problem: Education: Goal: Knowledge of General Education information will improve Outcome: Progressing   Problem: Clinical Measurements: Goal: Ability to maintain clinical measurements within normal limits will improve Outcome: Progressing Goal: Respiratory complications will improve Outcome: Progressing Goal: Cardiovascular complication will be avoided Outcome: Progressing   Problem: Elimination: Goal: Will not experience complications related to bowel motility Outcome: Progressing   Problem: Pain Managment: Goal: General experience of comfort will improve Outcome: Progressing   Problem: Safety: Goal: Ability to remain free from injury will improve Outcome: Progressing   Problem: Skin Integrity: Goal: Risk for impaired skin integrity will decrease Outcome: Progressing

## 2017-09-12 NOTE — Progress Notes (Signed)
[facility] Gastroenterology Progress Note  Randy Nielsen 67 y.o. 07/24/1950  CC:  jaundice   Subjective: no acute issues overnight. Continues to have abdominal distention. Complaining of fatigue and weakness.  ROS : negative for active chest pain. Positive for fatigue weakness. Positive for weight loss.   Objective: Vital signs in last 24 hours: Vitals:   09/11/17 2100 09/12/17 0458  BP: 105/70 (!) 94/59  Pulse: 68 72  Resp: 18 17  Temp: 97.8 F (36.6 C) 98.5 F (36.9 C)  SpO2: 100% 100%    Physical Exam:  Constitutional: He is oriented to person, place, and time. No distress.  Cachectic-appearing patient. Not in acute distress  HENT:  Head: Normocephalic and atraumatic.  Mouth/Throat: Oropharynx is clear and moist. No oropharyngeal exudate.  Eyes: EOM are normal. Scleral icterus is present.  Neck: Normal range of motion. Neck supple.  Cardiovascular: Normal rate and regular rhythm.  Murmur heard. Pulmonary/Chest: Effort normal and breath sounds normal. No respiratory distress.  Abdominal: Bowel sounds are normal. He exhibits distension. There is no rebound and no guarding.  Significantly distended abdomen with generalized discomfort on palpation  Musculoskeletal: Normal range of motion.  No edema      Lab Results: Recent Labs    09/11/17 0448 09/12/17 0358  NA 137 137  K 3.3* 2.6*  CL 108 107  CO2 16* 21*  GLUCOSE 71 92  BUN 55* 38*  CREATININE 2.46* 1.75*  CALCIUM 7.8* 8.0*   Recent Labs    09/11/17 0448 09/12/17 0358  AST 127* 96*  ALT 49 42  ALKPHOS 454* 330*  BILITOT 18.5* 20.5*  PROT 5.8* 5.6*  ALBUMIN 1.4* 2.2*   Recent Labs    09/09/17 2110 09/10/17 0418  WBC 15.9* 11.3*  NEUTROABS  --  9.0*  HGB 11.9* 10.5*  HCT 33.9* 30.4*  MCV 94.7 95.0  PLT 136* 114*   Recent Labs    09/10/17 0313 09/11/17 0448  LABPROT 26.4* 18.8*  INR 2.45 1.59      Assessment/Plan: - abnormal LFTs with jaundice.  CT scan showing severe biliary ductal  dilatation with abrupt tapering of the common bile duct at the pancreatic head. 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  - clinical picture also concerning for cirrhosis. MELD 34. Ascites with low  fluid albumin. Low serum albumin. Thrombocytopenia. Elevated INR, - coagulopathy. INR 2.45 on presentation.improving. INR 1.59 now. - Mild elevated troponins. Most likely supply demand mismatch. Patient denies any active chest pain. - acute kidney injury. Creatinine improving - Right liver lobe lesion of around 6.8 cm. Concerning for cholangiocarcinoma. - Ascites.negative for SBP.  fluid albumin less than 1. - Weight loss - positive hepatitis C antibody.   Recommendations ------------------------ - significantly elevated CA 19-9 @ 7456. 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 MRI MRCP report pending. - tentatively plan for ERCP with possible stent placement tomorrow at 11 AM.procedure, risks, benefits and alternatives discussed with the patient and patient's friend . Risk includes but not limited to infection, bleeding, perforation and post-ERCP pancreatitis.   - Follow HCV PCR. AFP normal.  - palliative care following. Appreciate input. - okay to have full liquid diet today. Nothing by mouth past midnight. - Continue IV albumin for additional one day. Currently on Zosyn. - Gi will follow.   Otis Brace MD, [place] 09/12/2017, 11:08 AM  Contact #  [phone]

## 2017-09-12 NOTE — Progress Notes (Signed)
Palliative care:  Follow-up with patient: He confirms what I spoke with family about earlier in the day (Please see earlier note). He tells me he would not want resuscitation efforts - elects DNR. Tells me when he is stable enough to go home, he would like hospice care at home. Also confirms that if he does have cancer, he is not interested in aggressive treatment. He tells me quality of life is important to him and he feels as though aggressive cancer treatment would decrease his quality of life. Proceed with procedures that may help control symptoms - possible stent placement tomorrow.  Updated [name] by phone about conversation with patient - she is in agreement - she asks me if a biopsy would be performed. We discussed that there may not be a need for a biopsy if the patient would not want treatment - she agrees.   Juel Burrow, DNP, AGNP-C Palliative Medicine Team Team Phone # [phone]

## 2017-09-12 NOTE — Progress Notes (Addendum)
Patient Demographics:    Randy Nielsen, is a 67 y.o. male, DOB - [birth year], Randy Nielsen  Admit date - 09/09/2017   Admitting Physician Rise Patience, MD  Outpatient Primary MD for the patient is [name], MD  LOS - 2   Chief Complaint  Patient presents with  . Weakness        Subjective:    Randy Nielsen today has no fevers, no emesis,  No chest pain, male friend at bedside, questions answered, wants to eat, abd pain is better  Assessment  & Plan :    Principal Problem:   Obstructive jaundice Active Problems:   Abdominal pain   Ascites   ARF (acute renal failure) (HCC)   Diabetes mellitus type 2 in nonobese (HCC)   Abdominal mass   Goals of care, counseling/discussion   Palliative care by specialist   Abnormal LFTs (liver function tests)  Abd MRI Report IMPRESSION: 1. Relatively dramatic dilatation of the intra and extrahepatic bile ducts with marked distention of the gallbladder. Common bile duct is 2.2 cm in carcinoma of the pancreas in tapers down to what appears to be a 10-15 mm "string sign" tracking into the ampulla. This region of the distal duct is not well evaluated due to lack of contrast and motion artifact. Imaging appearance does raise the question of a pancreatic head lesion causing obstruction. No evidence for cholelithiasis or choledocholithiasis. ERCP/EUS would likely prove helpful to further evaluate. 2. Innumerable liver lesions ranging in size from 10 mm up to 9 cm. Imaging features compatible with metastatic disease and 1 of the larger lesions may represent primary source. 3. Large volume ascites 4. Body wall edema  Brief Summary:- 66 y.o. male with history of diabetes mellitus, hypertension previous pancreatitis and pancreatic mass has not been to a doctor for many years admitted on 09/10/17 with obstructive jaundice after presenting with abdominal  pain and distention .  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 Alessandra Bevels.  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 Alessandra Bevels.  LFTs trending down however bilirubin is trending up, acute hepatitis panel remarkable for hep C antibody, patient denies recent alcohol use.  CA-19-9 is elevated  2)Ascites-status post ultrasound-guided pressure transfusion on 09/10/2017 with 2.5 L of yellow fluid removed, await studies including Gram stain and culture, continue IV Zosyn started on 09/10/2017  3)ARF-acute renal failure with acidosis,c/n IVF,   Hepatorenal syndrome, creatinine is down to 1.75, continue to avoid nephrotoxic agents  4) anemia/thrombocytopenia-suspect this is related to underlying liver disease, no evidence of ongoing bleeding, monitor closely and transfuse as clinically indicated  5) severe protein caloric malnutrition-once acute GI issues improved patient will need increased nutritional intake including supplements, dietary consultation will be needed  6)Social/Ethics-palliative care consult requested, Care conference meeting with palliative  team on 09/12/2017 , patient is a DNR/DNI with limitations of treatment, however patient would like ERCP with stent placement on 09/13/17 to try to relieve/alleviate symptoms.  Otherwise patient and family not interested in liver biopsy or oncological interventions, plan will be for discharge home with hospice  7)DM- A1c  is 5.2, Allow some permissive Hyperglycemia rather than risk life-threatening hypoglycemia in a patient with unreliable oral intake. 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 Ascites with low  fluid albumin. Low serum albumin. Thrombocytopenia. Elevated INR, - coagulopathy. INR 2.45 on presentation.improving. INR 1.59 now.   Code Status : DNR  Disposition Plan  : Home with hospice  Consults  :  Gi/palliative care/IR  DVT Prophylaxis  :   - SCDs (thrombocytopenia)  Lab Results  Component Value Date   PLT 114 (L) 09/10/2017    Inpatient Medications  Scheduled Meds: . potassium chloride  40 mEq Oral Once   Continuous Infusions: . piperacillin-tazobactam (ZOSYN)  IV    .  sodium bicarbonate  infusion 1000 mL 75 mL/hr at 09/12/17 0241   PRN Meds:.acetaminophen **OR** acetaminophen, lidocaine, ondansetron **OR** ondansetron (ZOFRAN) IV    Anti-infectives (From admission, onward)   Start     Dose/Rate Route Frequency Ordered Stop   09/12/17 1900  piperacillin-tazobactam (ZOSYN) IVPB 3.375 g     3.375 g 12.5 mL/hr over 240 Minutes Intravenous Every 8 hours 09/12/17 1218     09/10/17 0800  piperacillin-tazobactam (ZOSYN) IVPB 3.375 g  Status:  Discontinued     3.375 g 12.5 mL/hr over 240 Minutes Intravenous Every 8 hours 09/10/17 0739 09/12/17 1218   09/10/17 0430  cefTRIAXone (ROCEPHIN) 2 g in sodium chloride 0.9 % 100 mL IVPB  Status:  Discontinued     2 g 200 mL/hr over 30 Minutes Intravenous Every 24 hours 09/10/17 0419 09/10/17 0739        Objective:   Vitals:   09/11/17 1734 09/11/17 2100 09/12/17 0458 09/12/17 1400  BP: 96/63 105/70 (!) 94/59 99/71  Pulse: 75 68 72 72  Resp: 19 18 17 18   Temp: 98.1 F (36.7 C) 97.8 F (36.6 C) 98.5 F (36.9 C) 99 F (37.2 C)  TempSrc: Oral Oral Oral Oral  SpO2: 95% 100% 100% 100%  Weight:   66 kg (145 lb 8.1 oz)   Height:        Wt  Readings from Last 3 Encounters:  09/12/17 66 kg (145 lb 8.1 oz)     Intake/Output Summary (Last 24 hours) at 09/12/2017 1610 Last data filed at 09/12/2017 1300 Gross per 24 hour  Intake 1896 ml  Output -  Net 1896 ml     Physical Exam  Gen:- Awake Alert, frail and chronically ill-appearing HEENT:- [place].AT, +ve sclera icterus Neck-Supple Neck,No JVD,.  Lungs-  CTAB , fair air movement CV- S1, S2 normal Abd-  +ve B.Sounds, abdominal distention, not firm, abdominal discomfort with palpation Extremity/Skin:- No  edema,   good pulses Psych-affect is appropriate, oriented x3 Neuro-no new focal deficits, generalized weakness   Data Review:   Micro Results Recent Results (from the past 240 hour(s))  Body fluid culture     Status: None (Preliminary result)   Collection Time: 09/10/17 11:42 AM  Result Value Ref Range Status   Specimen Description PERITONEAL CAVITY  Final   Special Requests NONE  Final   Gram Stain   Final    RARE WBC PRESENT, PREDOMINANTLY  MONONUCLEAR NO ORGANISMS SEEN    Culture   Final    NO GROWTH 2 DAYS Performed at [facility], [place] [address] [place], [place] [address]    Report Status PENDING  Incomplete    Radiology Reports Ct Abdomen Pelvis Wo Contrast  Result Date: 09/10/2017 CLINICAL DATA:  67 y/o M; weakness, abdominal pain, swelling, jaundice, nausea, and not eating. EXAM: CT ABDOMEN AND PELVIS WITHOUT CONTRAST TECHNIQUE: Multidetector CT imaging of the abdomen and pelvis was performed following the standard protocol without IV contrast. COMPARISON:  11/16/2007 CT of abdomen and pelvis FINDINGS: Lower chest: Small left pleural effusion. Hepatobiliary: Severe intra and extrahepatic biliary ductal dilatation and distention of the gallbladder. Small gallstones. The common bile duct is dilated to the pancreatic head where it abruptly tapers (series 6, image 78). Common bile duct measures up to 3.3 cm in diameter. Multiple hypoattenuating pancreatic  lesions, several with coarse calcifications, the largest in the right lobe of the liver peripherally spanning 6.8 x 6.0 cm (series 3, image 21). The large right lobe of liver lesion has a suggestion of capsular retraction. Pancreas: No significant dilatation of the pancreatic duct. No peripancreatic inflammatory change. Spleen: Normal in size without focal abnormality. Adrenals/Urinary Tract: Adrenal glands are unremarkable. Kidneys are normal, without renal calculi, focal lesion, or hydronephrosis. Bladder is unremarkable. Stomach/Bowel: Stomach is within normal limits. Appendix not identified. No evidence of bowel wall thickening, distention, or inflammatory changes. Vascular/Lymphatic: Aortic atherosclerosis. No enlarged abdominal or pelvic lymph nodes. Reproductive: Prostate is unremarkable. Other: Large volume of peritoneal ascites. Musculoskeletal: No fracture is seen. IMPRESSION: 1. Severe intra and extrahepatic biliary ductal dilatation and distention of the gallbladder. Abrupt tapering of the common bile duct within pancreatic head may represent underlying mass, stricture, or radiolucent stone. 2. Large volume of peritoneal ascites. 3. Ill-defined hypoattenuating lesions within the liver with coarse calcifications measuring up to 6.8 cm in the right lobe of the liver. Findings of capsular retraction suggest cholangiocarcinoma. 4. When the patient is clinically stable and able to follow directions and hold their breath further evaluation with dedicated abdominal MRI should be considered. Electronically Signed   By: Kristine Garbe M.D.   On: 09/10/2017 03:00   Mr Abdomen Mrcp Wo Contrast  Result Date: 09/12/2017 CLINICAL DATA:  Biliary dilatation. EXAM: MRI ABDOMEN WITHOUT CONTRAST  (INCLUDING MRCP) TECHNIQUE: Multiplanar multisequence MR imaging of the abdomen was performed. Heavily T2-weighted images of the biliary and pancreatic ducts were obtained, and three-dimensional MRCP images were  rendered by post processing. COMPARISON:  CT scan 09/09/2017 FINDINGS: Lower chest: Small left pleural effusion with dependent atelectasis. Hepatobiliary: Innumerable liver lesions are identified in both hepatic lobes. Most of these measure in the 10-15 mm size range although a large 5.6 x 8.9 cm lesions identified at the junction of segments VII and VIII. 3.5 x 3.7 cm lesion is identified in segment IV. Gallbladder is markedly distended. There is dramatic intra and extrahepatic biliary duct dilatation. Common bile duct is dilated up to 2.2 cm with relatively abrupt tapering in the head of the pancreas with about 10-15 mm of "string sign" proximal to the ampulla. This area is not well evaluated due to motion artifact. No obstructing mass lesion is evident although exam is limited by motion artifact and lack of intravenous contrast material. Pancreas: Not well seen due to motion artifact. No substantial dilatation of the main pancreatic duct. Spleen:  Unremarkable. Adrenals/Urinary Tract: No adrenal nodule or mass. Small cysts are noted in the  kidneys bilaterally. Stomach/Bowel: Stomach is nondilated. No bowel dilatation evident within the visualized abdomen. Vascular/Lymphatic: No abdominal aortic aneurysm. Flow void in the portal vein and superior mesenteric vein suggests patency. No bulky lymphadenopathy evident although assessment limited by lack of contrast material. Other:  Large volume ascites. Musculoskeletal: Body wall edema. No abnormal marrow signal evident. IMPRESSION: 1. Relatively dramatic dilatation of the intra and extrahepatic bile ducts with marked distention of the gallbladder. Common bile duct is 2.2 cm in the head of the pancreas in tapers down to what appears to be a 10-15 mm "string sign" tracking into the ampulla. This region of the distal duct is not well evaluated due to lack of contrast and motion artifact. Imaging appearance does raise the question of a pancreatic head lesion causing  obstruction. No evidence for cholelithiasis or choledocholithiasis. ERCP/EUS would likely prove helpful to further evaluate. 2. Innumerable liver lesions ranging in size from 10 mm up to 9 cm. Imaging features compatible with metastatic disease and 1 of the larger lesions may represent primary source. 3. Large volume ascites 4. Body wall edema Electronically Signed   By: Misty Stanley M.D.   On: 09/12/2017 13:36   Mr 3d Recon At Scanner  Result Date: 09/12/2017 CLINICAL DATA:  Biliary dilatation. EXAM: MRI ABDOMEN WITHOUT CONTRAST  (INCLUDING MRCP) TECHNIQUE: Multiplanar multisequence MR imaging of the abdomen was performed. Heavily T2-weighted images of the biliary and pancreatic ducts were obtained, and three-dimensional MRCP images were rendered by post processing. COMPARISON:  CT scan 09/09/2017 FINDINGS: Lower chest: Small left pleural effusion with dependent atelectasis. Hepatobiliary: Innumerable liver lesions are identified in both hepatic lobes. Most of these measure in the 10-15 mm size range although a large 5.6 x 8.9 cm lesions identified at the junction of segments VII and VIII. 3.5 x 3.7 cm lesion is identified in segment IV. Gallbladder is markedly distended. There is dramatic intra and extrahepatic biliary duct dilatation. Common bile duct is dilated up to 2.2 cm with relatively abrupt tapering in the head of the pancreas with about 10-15 mm of "string sign" proximal to the ampulla. This area is not well evaluated due to motion artifact. No obstructing mass lesion is evident although exam is limited by motion artifact and lack of intravenous contrast material. Pancreas: Not well seen due to motion artifact. No substantial dilatation of the main pancreatic duct. Spleen:  Unremarkable. Adrenals/Urinary Tract: No adrenal nodule or mass. Small cysts are noted in the kidneys bilaterally. Stomach/Bowel: Stomach is nondilated. No bowel dilatation evident within the visualized abdomen. Vascular/Lymphatic:  No abdominal aortic aneurysm. Flow void in the portal vein and superior mesenteric vein suggests patency. No bulky lymphadenopathy evident although assessment limited by lack of contrast material. Other:  Large volume ascites. Musculoskeletal: Body wall edema. No abnormal marrow signal evident. IMPRESSION: 1. Relatively dramatic dilatation of the intra and extrahepatic bile ducts with marked distention of the gallbladder. Common bile duct is 2.2 cm in the head of the pancreas in tapers down to what appears to be a 10-15 mm "string sign" tracking into the ampulla. This region of the distal duct is not well evaluated due to lack of contrast and motion artifact. Imaging appearance does raise the question of a pancreatic head lesion causing obstruction. No evidence for cholelithiasis or choledocholithiasis. ERCP/EUS would likely prove helpful to further evaluate. 2. Innumerable liver lesions ranging in size from 10 mm up to 9 cm. Imaging features compatible with metastatic disease and 1 of the larger lesions may represent  primary source. 3. Large volume ascites 4. Body wall edema Electronically Signed   By: Misty Stanley M.D.   On: 09/12/2017 13:36   Dg Chest Port 1 View  Result Date: 09/10/2017 CLINICAL DATA:  Abdominal pain and swelling for 2 days EXAM: PORTABLE CHEST 1 VIEW COMPARISON:  None. FINDINGS: The lungs are not optimally aerated, with some elevation of the right hemidiaphragm and mild volume loss at the left lung base. However no definite pneumonia is seen and no pleural effusion is noted. Mediastinal and hilar contours are unremarkable and the heart is within upper limits of normal. No bony abnormality is seen. IMPRESSION: Poor inspiration with mild volume loss at the lung bases. No definite active process. Electronically Signed   By: Ivar Drape M.D.   On: 09/10/2017 08:09   Ir Paracentesis  Result Date: 09/10/2017 INDICATION: Patient with history of abdominal pain, jaundice, elevated liver function  tests/creatinine, intra/extrahepatic biliary ductal dilatation, liver lesions, ascites. Request made for diagnostic and therapeutic paracentesis. EXAM: ULTRASOUND GUIDED DIAGNOSTIC AND THERAPEUTIC PARACENTESIS MEDICATIONS: None COMPLICATIONS: None immediate. PROCEDURE: Informed written consent was obtained from the patient after a discussion of the risks, benefits and alternatives to treatment. A timeout was performed prior to the initiation of the procedure. Initial ultrasound scanning demonstrates a moderate amount of ascites within the left lower abdominal quadrant. The left lower abdomen was prepped and draped in the usual sterile fashion. 2% lidocaine was used for local anesthesia. Following this, a 6 Fr Safe-T-Centesis catheter was introduced. An ultrasound image was saved for documentation purposes. The paracentesis was performed. The catheter was removed and a dressing was applied. The patient tolerated the procedure well without immediate post procedural complication. FINDINGS: A total of approximately 2.5 liters of golden yellow fluid was removed. Samples were sent to the laboratory as requested by the clinical team. IMPRESSION: Successful ultrasound-guided diagnostic and therapeutic paracentesis yielding 2.5 liters of peritoneal fluid. Read by: Rowe Robert, PA-C Electronically Signed   By: Jerilynn Mages.  Shick M.D.   On: 09/10/2017 12:05     CBC Recent Labs  Lab 09/09/17 2110 09/10/17 0418  WBC 15.9* 11.3*  HGB 11.9* 10.5*  HCT 33.9* 30.4*  PLT 136* 114*  MCV 94.7 95.0  MCH 33.2 32.8  MCHC 35.1 34.5  RDW 16.7* 16.9*  LYMPHSABS  --  1.8  MONOABS  --  0.5  EOSABS  --  0.0  BASOSABS  --  0.0    Chemistries  Recent Labs  Lab 09/09/17 2110 09/09/17 2233 09/10/17 0418 09/11/17 0448 09/12/17 0358  NA 136  --  136 137 137  K 3.7  --  3.6 3.3* 2.6*  CL 103  --  106 108 107  CO2 19*  --  17* 16* 21*  GLUCOSE 65  --  68 71 92  BUN 52*  --  51* 55* 38*  CREATININE 2.36*  --  2.45* 2.46*  1.75*  CALCIUM 8.2*  --  7.8* 7.8* 8.0*  MG  --   --   --   --  1.9  AST  --  152* 143* 127* 96*  ALT  --  55 49 49 42  ALKPHOS  --  517* 483* 454* 330*  BILITOT  --  18.1* 16.7* 18.5* 20.5*   ------------------------------------------------------------------------------------------------------------------ No results for input(s): CHOL, HDL, LDLCALC, TRIG, CHOLHDL, LDLDIRECT in the last 72 hours.  Lab Results  Component Value Date   HGBA1C 5.2 09/10/2017   ------------------------------------------------------------------------------------------------------------------ No results for input(s): TSH, T4TOTAL, T3FREE,  THYROIDAB in the last 72 hours.  Invalid input(s): FREET3 ------------------------------------------------------------------------------------------------------------------ Recent Labs    09/10/17 0418  VITAMINB12 >7,500*  FOLATE 7.4  FERRITIN 1,819*  TIBC NOT CALCULATED  IRON 26*  RETICCTPCT 1.7    Coagulation profile Recent Labs  Lab 09/10/17 0313 09/11/17 0448  INR 2.45 1.59    No results for input(s): DDIMER in the last 72 hours.  Cardiac Enzymes Recent Labs  Lab 09/10/17 0847 09/11/17 0448  TROPONINI 0.30* 0.08*   ------------------------------------------------------------------------------------------------------------------ No results found for: BNP   Roxan Hockey M.D on 09/12/2017 at 4:10 PM  Between 7am to 7pm - Pager - [phone]  After 7pm go to [email or web address] - password [email or web address]  [facility] -  Office  [phone]   Voice Recognition Viviann Spare dictation system was used to create this note, attempts have been made to correct errors. Please contact the author with questions and/or clarifications.

## 2017-09-12 NOTE — Progress Notes (Signed)
Chaplain Note re; AD  Assisted Patient in having his HCPOA notarized and witnessed. Copy given to Nursing Secretary.   Wells Guiles

## 2017-09-12 NOTE — Progress Notes (Signed)
Daily Progress Note   Patient Name: Randy Nielsen       Date: 09/12/2017 DOB: [birth year]  Age: 67 y.o. MRN#: [id number] Attending Physician: Roxan Hockey, MD Primary Care Physician: [name], MD Admit Date: 09/09/2017  Reason for Consultation/Follow-up: Establishing goals of care and Psychosocial/spiritual support  Subjective: Patient tells me he wants to sleep and doesn't want to talk about Randy Nielsen, tells me I can talk to his family.   Length of Stay: 2  Current Medications: Scheduled Meds:    Continuous Infusions: . piperacillin-tazobactam (ZOSYN)  IV Stopped (09/12/17 0447)  . potassium chloride Stopped (09/12/17 0744)  .  sodium bicarbonate  infusion 1000 mL 75 mL/hr at 09/12/17 0241    PRN Meds: acetaminophen **OR** acetaminophen, lidocaine, ondansetron **OR** ondansetron (ZOFRAN) IV  Physical Exam  Constitutional: He is oriented to person, place, and time. He appears cachectic. He has a sickly appearance. No distress.  HENT:  Head: Normocephalic and atraumatic.  Cardiovascular: Normal rate and regular rhythm.  Pulmonary/Chest: Effort normal. No accessory muscle usage. No tachypnea. No respiratory distress.  Abdominal: He exhibits distension. There is tenderness.  Musculoskeletal:       Right lower leg: He exhibits no edema.       Left lower leg: He exhibits no edema.  Neurological: He is alert and oriented to person, place, and time.  Skin: He is not diaphoretic.  Psychiatric: He is withdrawn.            Vital Signs: BP (!) 94/59 (BP Location: Right Arm)   Pulse 72   Temp 98.5 F (36.9 C) (Oral)   Resp 17   Ht _0  (1.676 m)   Wt 66 kg (145 lb 8.1 oz)   SpO2 100%   BMI 23.48 kg/m  SpO2: SpO2: 100 % O2 Device: O2 Device: Nasal Cannula O2 Flow Rate: O2 Flow Rate  (L/min): 3 L/min  Intake/output summary:   Intake/Output Summary (Last 24 hours) at 09/12/2017 0908 Last data filed at 09/12/2017 0700 Gross per 24 hour  Intake 2020 ml  Output -  Net 2020 ml   LBM: Last BM Date: 09/10/17 Baseline Weight: Weight: 65.4 kg (144 lb 2.9 oz) Most recent weight: Weight: 66 kg (145 lb 8.1 oz)       Palliative Assessment/Data: PPS 50%    Flowsheet Rows     Most Recent Value  Intake Tab  Referral Department  Hospitalist  Unit at Time of Referral  Cardiac/Telemetry Unit  Palliative Care Primary Diagnosis  Other (Comment) [possible cholangiocarcinoma]  Date Notified  09/10/17  Palliative Care Type  New Palliative care  Reason for referral  Clarify Goals of Care  Date of Admission  09/09/17  Date first seen by Palliative Care  09/10/17  # of days Palliative referral response time  0 Day(s)  # of days IP prior to Palliative referral  1  Clinical Assessment  Palliative Performance Scale Score  50%  Psychosocial & Spiritual Assessment  Palliative Care Outcomes  Patient/Family meeting held?  Yes  Who was at the meeting?  patient and sig other, Randy Nielsen Outcomes  Provided psychosocial or spiritual support      Patient Active Problem List  Diagnosis Date Noted  . Abdominal pain 09/10/2017  . Obstructive jaundice 09/10/2017  . Ascites 09/10/2017  . ARF (acute renal failure) ([place]) 09/10/2017  . 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 Abdominal mass   . Goals of care, counseling/discussion   . Palliative care by specialist     Palliative Care Assessment & Plan   HPI: 67 y.o.malewith past medical history of DM, HTN, pancreatitis, and pancreatic massadmitted on 5/6/2019with abdominal pain, distention, nausea, and vomiting.Labs revealed markedly elevated LFTs with jaundice.CT abd reveals severe intra and extrahepatic biliary ductal dilatation and distention of the gallbladder, possibleunderlying mass, stricture, or  stone, large volume of peritoneal ascites, andlesions within the liver.Findingssuggest possiblecholangiocarcinoma.  PMT consulted for Randy Nielsen.  Assessment: Patient again tells me he does not want to discuss goals of care - told me this the first time we met a few days ago. He gave permission to discuss with family outside of room.   Spoke with patient's family - significant other, Randy Nielsen, and son, Randy Nielsen - outside of room. We discussed patient's poor prognosis. They were not surprised and said they knew patient was not doing well based on his recent functional and nutritional decline at home. Their goals for him are comfort and quality of life. If the patient does have cancer, they do not want to pursue treatment and do not believe the patient would either. They also would like the patient to be DNR. They are interested in hospice services at home. We discussed type of support hospice provides.  They tell me patient has multiple children, but the other children are not involved with patient's healthcare and Randy Nielsen is biggest source of support. They tell me the patient is very private - he would not want them to be involved.   I shared with them the importance of completing HCPOA paperwork with patient. We discussed potential of fluctuating mental status d/t liver failure.  Family is concerned that patient will not participate in Randy Nielsen conversations. They tell me "he knows what's going on, he just doesn't want to hear it". We discussed that I would come back this afternoon and have a brief follow up with patient about code status/disposition/hospice and further treatments.   Recommendations/Plan:  Patient resistant to [place] conversations  Family's goal is comfort/quality of life. Family interested in home hospice and DNR status - they believe these are patients goals as well  Will f/u this afternoon to attempt conversation with patient again to confirm patients goals  Patient and family waiting  results from MRI  Encouraged family to complete HCPOA documents with patient while he is able  Symptoms currently controlled  Goals of Care and Additional Recommendations:  Limitations on Scope of Treatment: Full Scope Treatment, continuing conversations  Code Status:  Full code, continuing conversations  Prognosis:   Unable to determine poor prognosis r/t MELD score 34, ascites, low serum albumin, poor functoinal/nutritional status  Discharge Planning:  To Be Determined - family interested in home with hospice   Care plan was discussed with patient's son and sig other  Thank you for allowing the Palliative Medicine Team to assist in the care of this patient.   Time In: 0900 Time Out: 1015 Total Time 75 minutes Prolonged Time Billed  yes       Greater than 50%  of this time was spent counseling and coordinating care related to the above assessment and plan.  Juel Burrow, DNP, AGNP-C Palliative Medicine Team Team Phone # [phone]

## 2017-09-13 ENCOUNTER — Inpatient Hospital Stay (HOSPITAL_COMMUNITY): Payer: Medicare Other | Admitting: Anesthesiology

## 2017-09-13 ENCOUNTER — Encounter (HOSPITAL_COMMUNITY): Payer: Self-pay | Admitting: Anesthesiology

## 2017-09-13 ENCOUNTER — Encounter (HOSPITAL_COMMUNITY)
Admission: EM | Disposition: A | Discharge status: Hospice | Payer: Self-pay | Source: Home / Self Care | Attending: Internal Medicine

## 2017-09-13 ENCOUNTER — Inpatient Hospital Stay (HOSPITAL_COMMUNITY): Payer: Medicare Other

## 2017-09-13 HISTORY — PX: ERCP: SHX5425

## 2017-09-13 LAB — COMPREHENSIVE METABOLIC PANEL
ALT: 43 U/L (ref 17–63)
AST: 96 U/L — AB (ref 15–41)
Albumin: 1.9 g/dL — ABNORMAL LOW (ref 3.5–5.0)
Alkaline Phosphatase: 385 U/L — ABNORMAL HIGH (ref 38–126)
Anion gap: 10 (ref 5–15)
BUN: 26 mg/dL — ABNORMAL HIGH (ref 6–20)
CHLORIDE: 108 mmol/L (ref 101–111)
CO2: 22 mmol/L (ref 22–32)
CREATININE: 1.39 mg/dL — AB (ref 0.61–1.24)
Calcium: 8.2 mg/dL — ABNORMAL LOW (ref 8.9–10.3)
GFR, EST AFRICAN AMERICAN: 59 mL/min — AB (ref >60)
GFR, EST NON AFRICAN AMERICAN: 51 mL/min — AB (ref >60)
Glucose, Bld: 90 mg/dL (ref 65–99)
POTASSIUM: 3.7 mmol/L (ref 3.5–5.1)
Sodium: 140 mmol/L (ref 135–145)
Total Bilirubin: 24.1 mg/dL (ref 0.3–1.2)
Total Protein: 5.8 g/dL — ABNORMAL LOW (ref 6.5–8.1)

## 2017-09-13 LAB — CBC
HCT: 25.8 % — ABNORMAL LOW (ref 39.0–52.0)
HEMOGLOBIN: 8.8 g/dL — AB (ref 13.0–17.0)
MCH: 32.6 pg (ref 26.0–34.0)
MCHC: 34.1 g/dL (ref 30.0–36.0)
MCV: 95.6 fL (ref 78.0–100.0)
Platelets: 66 10*3/uL — ABNORMAL LOW (ref 150–400)
RBC: 2.7 MIL/uL — AB (ref 4.22–5.81)
RDW: 15.7 % — ABNORMAL HIGH (ref 11.5–15.5)
WBC: 7.2 10*3/uL (ref 4.0–10.5)

## 2017-09-13 LAB — BODY FLUID CULTURE: CULTURE: NO GROWTH

## 2017-09-13 LAB — GLUCOSE, CAPILLARY
GLUCOSE-CAPILLARY: 103 mg/dL — AB (ref 65–99)
GLUCOSE-CAPILLARY: 104 mg/dL — AB (ref 65–99)
GLUCOSE-CAPILLARY: 76 mg/dL (ref 65–99)
GLUCOSE-CAPILLARY: 93 mg/dL (ref 65–99)
Glucose-Capillary: 84 mg/dL (ref 65–99)
Glucose-Capillary: 100 mg/dL — ABNORMAL HIGH (ref 65–99)

## 2017-09-13 LAB — SODIUM, URINE, RANDOM: Sodium, Ur: 22 mmol/L

## 2017-09-13 LAB — PROTIME-INR
INR: 1.47
PROTHROMBIN TIME: 17.7 s — AB (ref 11.4–15.2)

## 2017-09-13 LAB — CREATININE, URINE, RANDOM: CREATININE, URINE: 115.21 mg/dL

## 2017-09-13 SURGERY — ERCP, WITH INTERVENTION IF INDICATED
Anesthesia: General

## 2017-09-13 MED ORDER — ONDANSETRON HCL 4 MG/2ML IJ SOLN
INTRAMUSCULAR | Status: DC | PRN
  Administered 2017-09-13: 4 mg via INTRAVENOUS

## 2017-09-13 MED ORDER — INDOMETHACIN 50 MG RE SUPP
RECTAL | Status: AC
  Filled 2017-09-13: qty 2

## 2017-09-13 MED ORDER — SODIUM CHLORIDE 0.9 % IV SOLN: INTRAVENOUS | Status: DC

## 2017-09-13 MED ORDER — SUCCINYLCHOLINE CHLORIDE 20 MG/ML IJ SOLN
INTRAMUSCULAR | Status: DC | PRN
  Administered 2017-09-13: 120 mg via INTRAVENOUS

## 2017-09-13 MED ORDER — LIDOCAINE HCL (CARDIAC) PF 100 MG/5ML IV SOSY
PREFILLED_SYRINGE | INTRAVENOUS | Status: DC | PRN
  Administered 2017-09-13: 100 mg via INTRAVENOUS

## 2017-09-13 MED ORDER — PHENYLEPHRINE HCL 10 MG/ML IJ SOLN
INTRAVENOUS | Status: DC | PRN
  Administered 2017-09-13: 70 ug/min via INTRAVENOUS

## 2017-09-13 MED ORDER — PROPOFOL 10 MG/ML IV BOLUS
INTRAVENOUS | Status: DC | PRN
  Administered 2017-09-13: 70 mg via INTRAVENOUS

## 2017-09-13 MED ORDER — INDOMETHACIN 50 MG RE SUPP
RECTAL | Status: DC | PRN
  Administered 2017-09-13: 100 mg via RECTAL

## 2017-09-13 MED ORDER — IOPAMIDOL (ISOVUE-300) INJECTION 61%
INTRAVENOUS | Status: DC | PRN
  Administered 2017-09-13: 1 mL via INTRAVENOUS

## 2017-09-13 MED ORDER — GLUCAGON HCL RDNA (DIAGNOSTIC) 1 MG IJ SOLR
INTRAMUSCULAR | Status: AC
  Filled 2017-09-13: qty 1

## 2017-09-13 MED ORDER — IOPAMIDOL (ISOVUE-300) INJECTION 61%
INTRAVENOUS | Status: AC
  Filled 2017-09-13: qty 100

## 2017-09-13 MED ORDER — LACTATED RINGERS IV SOLN
INTRAVENOUS | Status: DC | PRN
  Administered 2017-09-13: 11:00:00 via INTRAVENOUS

## 2017-09-13 MED ORDER — SODIUM CHLORIDE 0.9 % IV BOLUS
500.0000 mL | Freq: Once | INTRAVENOUS | Status: AC
  Administered 2017-09-13: 500 mL via INTRAVENOUS

## 2017-09-13 MED ORDER — LACTATED RINGERS IV SOLN
INTRAVENOUS | Status: AC | PRN
  Administered 2017-09-13: 1000 mL via INTRAVENOUS

## 2017-09-13 NOTE — Care Management Note (Signed)
Case Management Note  Patient Details  Name: Randy Nielsen MRN: [id number] Date of Birth: 30-Jun-1950  Subjective/Objective:                 Spoke w patient and significant other [name] at bedside. They are interested in home hospice w [facility]. Referral placed to Edyth Gunnels RN. Patient will DC via private car. Do not anticipate O2 or bed. Patient states he is still getting up and around well, gets up to use bathroom here w/o problems. [name] states she can provide 24 hour supervision at DC. She states they have a 3/1 fir use later if needed.    Action/Plan:  Anticipate DC w home hospice services through [facility].   Expected Discharge Date:                  Expected Discharge Plan:  Home w Hospice Care  In-House Referral:     Discharge planning Services  CM Consult  Post Acute Care Choice:  Hospice Choice offered to:  Spouse  DME Arranged:    DME Agency:     HH Arranged:    [place]:  [facility]  Status of Service:  Completed, signed off  If discussed at [place] of Stay Meetings, dates discussed:    Additional Comments:  Carles Collet, RN 09/13/2017, 2:37 PM

## 2017-09-13 NOTE — Progress Notes (Signed)
[facility]                                                                              Patient Demographics  Randy Nielsen, is a 67 y.o. male, DOB - 04-10-1951, UJW:[id number]  Admit date - 09/09/2017   Admitting Physician Rise Patience, MD  Outpatient Primary MD for the patient is [name], MD  Outpatient specialists:   LOS - 3  days   Medical records reviewed and are as summarized below:    Chief Complaint  Patient presents with  . Weakness       Brief summary   66 y.o.malewithhistory of diabetes mellitus, hypertension previous pancreatitis and pancreatic mass has not been to a doctor for many years admitted on 09/10/17 with obstructive jaundice after presenting with abdominal pain and distention .  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 Alessandra Bevels.  Palliative care input appreciated, patient DNR with limitations.  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 -CT abdomen pelvis with possible cholangiocarcinoma, MRCP showed dramatic dilatation of intra-and extrahepatic bile ducts with marked distention of gallbladder, CBD 2.2 cm, pancreatic head lesion causing obstruction, recommended ERCP/EUS.  Innumerable liver lesions compatible with metastatic disease. -GI following, plan for ERCP with possible stenting today -Elevated CA-[id number], normal AFP, elevated bilirubin 24.1, trending up, transaminitis with obstructive pattern - Palliative care consulted.  Active Problems:    Ascites -Status post ultrasound-guided paracentesis on 5/7, 2.5 L ascitic fluid removed, studies negative for  SBP - Continue IV Zosyn    ARF (acute renal failure) (HCC) -Likely due to #1, possible hepatorenal syndrome, continue to avoid nephrotoxic agents, creatinine improving, 1.39 today, plateaued at 2.46    Diabetes mellitus type 2 in nonobese (HCC) Hemoglobin A1c 5.2, continue sliding scale insulin-   Anemia, thrombocytopenia -Likely due to #1, transfuse as indicated for hemoglobin less than 8  Goals of care -Palliative care following, patient and family not interested in liver biopsy or oncological intervention, palliative ERCP with stent placement today.  Severe protein calorie malnutrition Continue nutritional supplements   Code Status: DNR DVT Prophylaxis: SCD Family Communication: Discussed in detail with the patient, all imaging results, lab results explained to the patient    Disposition Plan: *Possible DC home in a.m. if no acute issues with hospice  Time Spent in minutes 72minutes  Procedures:  MRCP  Consultants:   GI Palliative Interventional radiology  Antimicrobials:   IV Zosyn   Medications  Scheduled Meds: Continuous Infusions: . sodium chloride    . lactated ringers    . [MAR Hold] piperacillin-tazobactam (ZOSYN)  IV Stopped (09/13/17 0845)  .  sodium bicarbonate  infusion 1000 mL 75 mL/hr at 09/12/17 1826   PRN Meds:.[MAR Hold] acetaminophen **OR** [MAR Hold] acetaminophen, lactated ringers, lidocaine, [MAR Hold] ondansetron **OR** [MAR Hold] ondansetron (ZOFRAN) IV   Antibiotics   Anti-infectives (  From admission, onward)   Start     Dose/Rate Route Frequency Ordered Stop   09/12/17 1900  [MAR Hold]  piperacillin-tazobactam (ZOSYN) IVPB 3.375 g     (MAR Hold since Fri 09/13/2017 at 1018. Reason: Transfer to a Procedural area.)   3.375 g 12.5 mL/hr over 240 Minutes Intravenous Every 8 hours 09/12/17 1218     09/10/17 0800  piperacillin-tazobactam (ZOSYN) IVPB 3.375 g  Status:  Discontinued     3.375 g 12.5 mL/hr over 240 Minutes Intravenous  Every 8 hours 09/10/17 0739 09/12/17 1218   09/10/17 0430  cefTRIAXone (ROCEPHIN) 2 g in sodium chloride 0.9 % 100 mL IVPB  Status:  Discontinued     2 g 200 mL/hr over 30 Minutes Intravenous Every 24 hours 09/10/17 0419 09/10/17 0739        Subjective:   Tesean Stump was seen and examined today.  No complaints, awaiting ERCP today.  No acute abdominal pain, nausea vomiting.  No fevers. Patient denies dizziness, chest pain, shortness of breath, new weakness, numbess, tingling. No acute events overnight.    Objective:   Vitals:   09/12/17 1400 09/12/17 2032 09/13/17 0555 09/13/17 1023  BP: 99/71 98/69 (!) 87/60 (!) 92/59  Pulse: 72 72 79   Resp: 18 12 12 11   Temp: 99 F (37.2 C) 98.8 F (37.1 C) 99.6 F (37.6 C) 98.4 F (36.9 C)  TempSrc: Oral Oral Oral Oral  SpO2: 100% 100% 100% 100%  Weight:   64.6 kg (142 lb 6.7 oz) 64.4 kg (142 lb)  Height:    5\' 6"  (1.676 m)    Intake/Output Summary (Last 24 hours) at 09/13/2017 1048 Last data filed at 09/13/2017 0858 Gross per 24 hour  Intake 168 ml  Output 100 ml  Net 68 ml     Wt Readings from Last 3 Encounters:  09/13/17 64.4 kg (142 lb)     Exam  General: Alert and oriented x 3, NAD, ill-appearing, frail  Eyes: + Scleral icterus  HEENT: no JVD  Cardiovascular: S1 S2 auscultated, Regular rate and rhythm.  Respiratory: Clear to auscultation bilaterally, no wheezing, rales or rhonchi  Gastrointestinal: Soft, diffuse abdominal tenderness on palpation, distended, NBS  Ext: no pedal edema bilaterally  Neuro: no new deficits  Musculoskeletal: No digital cyanosis, clubbing  Skin: No rashes  Psych: Normal affect and demeanor, alert and oriented x3    Data Reviewed:  I have personally reviewed following labs and imaging studies  Micro Results Recent Results (from the past 240 hour(s))  Body fluid culture     Status: None (Preliminary result)   Collection Time: 09/10/17 11:42 AM  Result Value Ref Range Status    Specimen Description PERITONEAL CAVITY  Final   Special Requests NONE  Final   Gram Stain   Final    RARE WBC PRESENT, PREDOMINANTLY MONONUCLEAR NO ORGANISMS SEEN    Culture   Final    NO GROWTH 3 DAYS Performed at [facility], [address] [place], [place] [address]    Report Status PENDING  Incomplete    Radiology Reports Ct Abdomen Pelvis Wo Contrast  Result Date: 09/10/2017 CLINICAL DATA:  67 y/o M; weakness, abdominal pain, swelling, jaundice, nausea, and not eating. EXAM: CT ABDOMEN AND PELVIS WITHOUT CONTRAST TECHNIQUE: Multidetector CT imaging of the abdomen and pelvis was performed following the standard protocol without IV contrast. COMPARISON:  11/16/2007 CT of abdomen and pelvis FINDINGS: Lower chest: Small left pleural effusion. Hepatobiliary: Severe intra  and extrahepatic biliary ductal dilatation and distention of the gallbladder. Small gallstones. The common bile duct is dilated to the pancreatic head where it abruptly tapers (series 6, image 78). Common bile duct measures up to 3.3 cm in diameter. Multiple hypoattenuating pancreatic lesions, several with coarse calcifications, the largest in the right lobe of the liver peripherally spanning 6.8 x 6.0 cm (series 3, image 21). The large right lobe of liver lesion has a suggestion of capsular retraction. Pancreas: No significant dilatation of the pancreatic duct. No peripancreatic inflammatory change. Spleen: Normal in size without focal abnormality. Adrenals/Urinary Tract: Adrenal glands are unremarkable. Kidneys are normal, without renal calculi, focal lesion, or hydronephrosis. Bladder is unremarkable. Stomach/Bowel: Stomach is within normal limits. Appendix not identified. No evidence of bowel wall thickening, distention, or inflammatory changes. Vascular/Lymphatic: Aortic atherosclerosis. No enlarged abdominal or pelvic lymph nodes. Reproductive: Prostate is unremarkable. Other: Large volume of peritoneal ascites.  Musculoskeletal: No fracture is seen. IMPRESSION: 1. Severe intra and extrahepatic biliary ductal dilatation and distention of the gallbladder. Abrupt tapering of the common bile duct within pancreatic head may represent underlying mass, stricture, or radiolucent stone. 2. Large volume of peritoneal ascites. 3. Ill-defined hypoattenuating lesions within the liver with coarse calcifications measuring up to 6.8 cm in the right lobe of the liver. Findings of capsular retraction suggest cholangiocarcinoma. 4. When the patient is clinically stable and able to follow directions and hold their breath further evaluation with dedicated abdominal MRI should be considered. Electronically Signed   By: Kristine Garbe M.D.   On: 09/10/2017 03:00   Mr Abdomen Mrcp Wo Contrast  Result Date: 09/12/2017 CLINICAL DATA:  Biliary dilatation. EXAM: MRI ABDOMEN WITHOUT CONTRAST  (INCLUDING MRCP) TECHNIQUE: Multiplanar multisequence MR imaging of the abdomen was performed. Heavily T2-weighted images of the biliary and pancreatic ducts were obtained, and three-dimensional MRCP images were rendered by post processing. COMPARISON:  CT scan 09/09/2017 FINDINGS: Lower chest: Small left pleural effusion with dependent atelectasis. Hepatobiliary: Innumerable liver lesions are identified in both hepatic lobes. Most of these measure in the 10-15 mm size range although a large 5.6 x 8.9 cm lesions identified at the junction of segments VII and VIII. 3.5 x 3.7 cm lesion is identified in segment IV. Gallbladder is markedly distended. There is dramatic intra and extrahepatic biliary duct dilatation. Common bile duct is dilated up to 2.2 cm with relatively abrupt tapering in the head of the pancreas with about 10-15 mm of "string sign" proximal to the ampulla. This area is not well evaluated due to motion artifact. No obstructing mass lesion is evident although exam is limited by motion artifact and lack of intravenous contrast material.  Pancreas: Not well seen due to motion artifact. No substantial dilatation of the main pancreatic duct. Spleen:  Unremarkable. Adrenals/Urinary Tract: No adrenal nodule or mass. Small cysts are noted in the kidneys bilaterally. Stomach/Bowel: Stomach is nondilated. No bowel dilatation evident within the visualized abdomen. Vascular/Lymphatic: No abdominal aortic aneurysm. Flow void in the portal vein and superior mesenteric vein suggests patency. No bulky lymphadenopathy evident although assessment limited by lack of contrast material. Other:  Large volume ascites. Musculoskeletal: Body wall edema. No abnormal marrow signal evident. IMPRESSION: 1. Relatively dramatic dilatation of the intra and extrahepatic bile ducts with marked distention of the gallbladder. Common bile duct is 2.2 cm in the head of the pancreas in tapers down to what appears to be a 10-15 mm "string sign" tracking into the ampulla. This region of the distal duct is  not well evaluated due to lack of contrast and motion artifact. Imaging appearance does raise the question of a pancreatic head lesion causing obstruction. No evidence for cholelithiasis or choledocholithiasis. ERCP/EUS would likely prove helpful to further evaluate. 2. Innumerable liver lesions ranging in size from 10 mm up to 9 cm. Imaging features compatible with metastatic disease and 1 of the larger lesions may represent primary source. 3. Large volume ascites 4. Body wall edema Electronically Signed   By: Misty Stanley M.D.   On: 09/12/2017 13:36   Mr 3d Recon At Scanner  Result Date: 09/12/2017 CLINICAL DATA:  Biliary dilatation. EXAM: MRI ABDOMEN WITHOUT CONTRAST  (INCLUDING MRCP) TECHNIQUE: Multiplanar multisequence MR imaging of the abdomen was performed. Heavily T2-weighted images of the biliary and pancreatic ducts were obtained, and three-dimensional MRCP images were rendered by post processing. COMPARISON:  CT scan 09/09/2017 FINDINGS: Lower chest: Small left pleural  effusion with dependent atelectasis. Hepatobiliary: Innumerable liver lesions are identified in both hepatic lobes. Most of these measure in the 10-15 mm size range although a large 5.6 x 8.9 cm lesions identified at the junction of segments VII and VIII. 3.5 x 3.7 cm lesion is identified in segment IV. Gallbladder is markedly distended. There is dramatic intra and extrahepatic biliary duct dilatation. Common bile duct is dilated up to 2.2 cm with relatively abrupt tapering in the head of the pancreas with about 10-15 mm of "string sign" proximal to the ampulla. This area is not well evaluated due to motion artifact. No obstructing mass lesion is evident although exam is limited by motion artifact and lack of intravenous contrast material. Pancreas: Not well seen due to motion artifact. No substantial dilatation of the main pancreatic duct. Spleen:  Unremarkable. Adrenals/Urinary Tract: No adrenal nodule or mass. Small cysts are noted in the kidneys bilaterally. Stomach/Bowel: Stomach is nondilated. No bowel dilatation evident within the visualized abdomen. Vascular/Lymphatic: No abdominal aortic aneurysm. Flow void in the portal vein and superior mesenteric vein suggests patency. No bulky lymphadenopathy evident although assessment limited by lack of contrast material. Other:  Large volume ascites. Musculoskeletal: Body wall edema. No abnormal marrow signal evident. IMPRESSION: 1. Relatively dramatic dilatation of the intra and extrahepatic bile ducts with marked distention of the gallbladder. Common bile duct is 2.2 cm in the head of the pancreas in tapers down to what appears to be a 10-15 mm "string sign" tracking into the ampulla. This region of the distal duct is not well evaluated due to lack of contrast and motion artifact. Imaging appearance does raise the question of a pancreatic head lesion causing obstruction. No evidence for cholelithiasis or choledocholithiasis. ERCP/EUS would likely prove helpful to  further evaluate. 2. Innumerable liver lesions ranging in size from 10 mm up to 9 cm. Imaging features compatible with metastatic disease and 1 of the larger lesions may represent primary source. 3. Large volume ascites 4. Body wall edema Electronically Signed   By: Misty Stanley M.D.   On: 09/12/2017 13:36   Dg Chest Port 1 View  Result Date: 09/10/2017 CLINICAL DATA:  Abdominal pain and swelling for 2 days EXAM: PORTABLE CHEST 1 VIEW COMPARISON:  None. FINDINGS: The lungs are not optimally aerated, with some elevation of the right hemidiaphragm and mild volume loss at the left lung base. However no definite pneumonia is seen and no pleural effusion is noted. Mediastinal and hilar contours are unremarkable and the heart is within upper limits of normal. No bony abnormality is seen. IMPRESSION: Poor inspiration  with mild volume loss at the lung bases. No definite active process. Electronically Signed   By: Ivar Drape M.D.   On: 09/10/2017 08:09   Ir Paracentesis  Result Date: 09/10/2017 INDICATION: Patient with history of abdominal pain, jaundice, elevated liver function tests/creatinine, intra/extrahepatic biliary ductal dilatation, liver lesions, ascites. Request made for diagnostic and therapeutic paracentesis. EXAM: ULTRASOUND GUIDED DIAGNOSTIC AND THERAPEUTIC PARACENTESIS MEDICATIONS: None COMPLICATIONS: None immediate. PROCEDURE: Informed written consent was obtained from the patient after a discussion of the risks, benefits and alternatives to treatment. A timeout was performed prior to the initiation of the procedure. Initial ultrasound scanning demonstrates a moderate amount of ascites within the left lower abdominal quadrant. The left lower abdomen was prepped and draped in the usual sterile fashion. 2% lidocaine was used for local anesthesia. Following this, a 6 Fr Safe-T-Centesis catheter was introduced. An ultrasound image was saved for documentation purposes. The paracentesis was performed. The  catheter was removed and a dressing was applied. The patient tolerated the procedure well without immediate post procedural complication. FINDINGS: A total of approximately 2.5 liters of golden yellow fluid was removed. Samples were sent to the laboratory as requested by the clinical team. IMPRESSION: Successful ultrasound-guided diagnostic and therapeutic paracentesis yielding 2.5 liters of peritoneal fluid. Read by: Rowe Robert, PA-C Electronically Signed   By: Jerilynn Mages.  Shick M.D.   On: 09/10/2017 12:05    Lab Data:  CBC: Recent Labs  Lab 09/09/17 2110 09/10/17 0418 09/13/17 0643  WBC 15.9* 11.3* 7.2  NEUTROABS  --  9.0*  --   HGB 11.9* 10.5* 8.8*  HCT 33.9* 30.4* 25.8*  MCV 94.7 95.0 95.6  PLT 136* 114* 66*   Basic Metabolic Panel: Recent Labs  Lab 09/09/17 2110 09/10/17 0418 09/11/17 0448 09/12/17 0358 09/13/17 0643  NA 136 136 137 137 140  K 3.7 3.6 3.3* 2.6* 3.7  CL 103 106 108 107 108  CO2 19* 17* 16* 21* 22  GLUCOSE 65 68 71 92 90  BUN 52* 51* 55* 38* 26*  CREATININE 2.36* 2.45* 2.46* 1.75* 1.39*  CALCIUM 8.2* 7.8* 7.8* 8.0* 8.2*  MG  --   --   --  1.9  --    GFR: Estimated Creatinine Clearance: 47.2 mL/min (A) (by C-G formula based on SCr of 1.39 mg/dL (H)). Liver Function Tests: Recent Labs  Lab 09/09/17 2233 09/10/17 0418 09/11/17 0448 09/12/17 0358 09/13/17 0643  AST 152* 143* 127* 96* 96*  ALT 55 49 49 42 43  ALKPHOS 517* 483* 454* 330* 385*  BILITOT 18.1* 16.7* 18.5* 20.5* 24.1*  PROT 7.1 6.4* 5.8* 5.6* 5.8*  ALBUMIN 1.2* 1.1* 1.4* 2.2* 1.9*   Recent Labs  Lab 09/09/17 2233  LIPASE 810*   No results for input(s): AMMONIA in the last 168 hours. Coagulation Profile: Recent Labs  Lab 09/10/17 0313 09/11/17 0448 09/13/17 0643  INR 2.45 1.59 1.47   Cardiac Enzymes: Recent Labs  Lab 09/10/17 0847 09/11/17 0448  TROPONINI 0.30* 0.08*   BNP (last 3 results) No results for input(s): PROBNP in the last 8760 hours. HbA1C: No results for  input(s): HGBA1C in the last 72 hours. CBG: Recent Labs  Lab 09/12/17 1616 09/12/17 2035 09/13/17 0004 09/13/17 0408 09/13/17 0758  GLUCAP 125* 103* 93 104* 76   Lipid Profile: No results for input(s): CHOL, HDL, LDLCALC, TRIG, CHOLHDL, LDLDIRECT in the last 72 hours. Thyroid Function Tests: No results for input(s): TSH, T4TOTAL, FREET4, T3FREE, THYROIDAB in the last 72 hours. Anemia Panel:  No results for input(s): VITAMINB12, FOLATE, FERRITIN, TIBC, IRON, RETICCTPCT in the last 72 hours. Urine analysis:    Component Value Date/Time   COLORURINE AMBER (A) 09/09/2017 0306   APPEARANCEUR HAZY (A) 09/09/2017 0306   LABSPEC 1.020 09/09/2017 0306   PHURINE 5.0 09/09/2017 0306   GLUCOSEU NEGATIVE 09/09/2017 0306   HGBUR NEGATIVE 09/09/2017 0306   BILIRUBINUR MODERATE (A) 09/09/2017 0306   KETONESUR NEGATIVE 09/09/2017 0306   PROTEINUR 30 (A) 09/09/2017 0306   UROBILINOGEN 1.0 11/08/2007 0028   NITRITE NEGATIVE 09/09/2017 0306   LEUKOCYTESUR NEGATIVE 09/09/2017 0306     Eriyana Sweeten M.D. [facility] 09/13/2017, 10:48 AM  Pager: [phone] Between 7am to 7pm - call Pager - [phone]  After 7pm go to [email or web address] - password [email or web address]  Call night coverage person covering after 7pm

## 2017-09-13 NOTE — Progress Notes (Signed)
Daily Progress Note   Patient Name: Randy Nielsen       Date: 09/13/2017 DOB: 1951/04/13  Age: 67 y.o. MRN#: [id number] Attending Physician: Mendel Corning, MD Primary Care Physician: [name], MD Admit Date: 09/09/2017  Reason for Consultation/Follow-up: Establishing goals of care, Hospice Evaluation and Psychosocial/spiritual support  Subjective: Just returned from ERCP, no complaints  Length of Stay: 3  Current Medications: Scheduled Meds:    Continuous Infusions: . piperacillin-tazobactam (ZOSYN)  IV 0 g (09/13/17 0845)  .  sodium bicarbonate  infusion 1000 mL 75 mL/hr at 09/12/17 1826    PRN Meds: acetaminophen **OR** acetaminophen, lidocaine, ondansetron **OR** ondansetron (ZOFRAN) IV  Physical Exam  Constitutional: He is oriented to person, place, and time. No distress.  HENT:  Head: Normocephalic and atraumatic.  Cardiovascular: Normal rate and regular rhythm.  Pulmonary/Chest: Effort normal. No respiratory distress.  Abdominal: He exhibits distension.  Neurological: He is alert and oriented to person, place, and time.  Skin: Skin is warm and dry. He is not diaphoretic.  Psychiatric: He is withdrawn.            Vital Signs: BP (!) 88/56   Pulse 63   Temp 97.8 F (36.6 C) (Oral)   Resp 10   Ht 5\' 6"  (1.676 m)   Wt 64.4 kg (142 lb)   SpO2 100%   BMI 22.92 kg/m  SpO2: SpO2: 100 % O2 Device: O2 Device: Room Air O2 Flow Rate: O2 Flow Rate (L/min): 3 L/min  Intake/output summary:   Intake/Output Summary (Last 24 hours) at 09/13/2017 1442 Last data filed at 09/13/2017 1255 Gross per 24 hour  Intake 550 ml  Output 101 ml  Net 449 ml   LBM: Last BM Date: 09/12/17 Baseline Weight: Weight: 65.4 kg (144 lb 2.9 oz) Most recent weight: Weight: 64.4 kg (142 lb)         Palliative Assessment/Data: PPS 50%    Flowsheet Rows     Most Recent Value  Intake Tab  Referral Department  Hospitalist  Unit at Time of Referral  Cardiac/Telemetry Unit  Palliative Care Primary Diagnosis  Other (Comment) [possible cholangiocarcinoma]  Date Notified  09/10/17  Palliative Care Type  New Palliative care  Reason for referral  Clarify Goals of Care  Date of Admission  09/09/17  Date first seen by Palliative Care  09/10/17  # of days Palliative referral response time  0 Day(s)  # of days IP prior to Palliative referral  1  Clinical Assessment  Palliative Performance Scale Score  50%  Psychosocial & Spiritual Assessment  Palliative Care Outcomes  Patient/Family meeting held?  Yes  Who was at the meeting?  patient and sig other, Randy Nielsen Outcomes  Provided psychosocial or spiritual support, Counseled regarding hospice      Patient Active Problem List   Diagnosis Date Noted  . Abnormal LFTs (liver function tests)   . Abdominal pain 09/10/2017  . Obstructive jaundice 09/10/2017  . Ascites 09/10/2017  . ARF (acute renal failure) ([facility]) 09/10/2017  . Diabetes mellitus type 2 in nonobese ([facility]) 09/10/2017  . Abdominal mass   . Goals of care, counseling/discussion   . Palliative care by specialist  Palliative Care Assessment & Plan   HPI: 66 y.o.malewith past medical history of DM, HTN, pancreatitis, and pancreatic massadmitted on 5/6/2019with abdominal pain, distention, nausea, and vomiting.Labs revealed markedly elevated LFTs with jaundice.CT abd reveals severe intra and extrahepatic biliary ductal dilatation and distention of the gallbladder, possibleunderlying mass, stricture, or stone, large volume of peritoneal ascites, andlesions within the liver.Findingssuggest possiblecholangiocarcinoma.  PMT consulted for [place].  Assessment: No complaints - no pain or nausea. We discussed moving forward with hospice care and the type of care  provided. GOC are clear: comfort and quality of life. Patient would not want cancer treatment.   Pt has completed advance directives and has named his son, Randy Nielsen as Economist.   Recommendations/Plan:  D/C home with hospice  No aggressive treatment   DNR completed and placed on chart  Symptoms currently controlled  No biopsy  Goals of Care and Additional Recommendations:  Limitations on Scope of Treatment: Avoid Hospitalization, No Chemotherapy and No Radiation  Code Status:  DNR  Prognosis:   < 6 months MELD score 34, ascites, low serum albumin, poor functoinal/nutritional status  Discharge Planning:  Home with Hospice  Care plan was discussed with patient and sig other, Randy Nielsen  Thank you for allowing the Palliative Medicine Team to assist in the care of this patient.   Total Time 15 minutes Prolonged Time Billed  no       Greater than 50%  of this time was spent counseling and coordinating care related to the above assessment and plan.  Juel Burrow, DNP, AGNP-C Palliative Medicine Team Team Phone # [phone]

## 2017-09-13 NOTE — Op Note (Signed)
[facility] Patient Name: Randy Nielsen Procedure Date : 09/13/2017 MRN: [id number] Attending MD: Arta Silence , MD Date of Birth: 03/16/1951 CSN: [id number] Age: 67 Admit Type: Inpatient Procedure:                ERCP Indications:              Biliary dilation on Ultrasound, Jaundice, Elevated                            liver enzymes Providers:                Arta Silence, MD, Otis Brace, MD, Baird Cancer, RN, Kingsley Plan, RN, Rowena Moilanen Dalton,                            Technician Referring MD:              Medicines:                General Anesthesia, Unasyn 3 g IV, Indomethacin 100                            mg PR Complications:            No immediate complications. Estimated Blood Loss:     Estimated blood loss: none. Procedure:                Pre-Anesthesia Assessment:                           - Prior to the procedure, a History and Physical                            was performed, and patient medications and                            allergies were reviewed. The patient's tolerance of                            previous anesthesia was also reviewed. The risks                            and benefits of the procedure and the sedation                            options and risks were discussed with the patient.                            All questions were answered, and informed consent                            was obtained. Prior Anticoagulants: The patient has                            taken no previous anticoagulant or  antiplatelet                            agents. ASA Grade Assessment: IV - A patient with                            severe systemic disease that is a constant threat                            to life. After reviewing the risks and benefits,                            the patient was deemed in satisfactory condition to                            undergo the procedure.                           After  obtaining informed consent, the scope was                            passed under direct vision. Throughout the                            procedure, the patient's blood pressure, pulse, and                            oxygen saturations were monitored continuously. The                            [id number] scope was introduced through the                            mouth, and used to inject contrast into and used to                            cannulate the dorsal pancreatic duct. The ERCP was                            technically difficult and complex due to                            challenging cannulation because of abnormal                            anatomy. The patient tolerated the procedure well. Scope In: Scope Out: Findings:      The major papilla was bulging. Multiple attempts to cannulate bile duct;       preferential cannulation of pancreatic duct, therefore 68m x 5cm pigtail       pancreatic duct stent was placed and a 4 mm biliary sphincterotomy was       made with a needle knife over a pancreatic stent using blended current.       There was no post-sphincterotomy bleeding. Repeated attempts to  cannulate over the pancreatic stent were unsuccessful. Impression:               - The major papilla appeared to be bulging.                           - A biliary sphincterotomy was performed.                           - Pancreatic duct stent placed.                           - Unable to cannulate bile duct. Moderate Sedation:      None Recommendation:           - Avoid aspirin and nonsteroidal anti-inflammatory                            medicines indefinitely.                           - Clear liquid diet until further notice.                           - Patient suboptimal candidate for percutaneous                            biliary drainage (PTC) given patient's ascites.                            Will discuss with Dr. Watt Climes about possible retry                             ERCP next week.                           [facility] will follow. Procedure Code(s):        --- Professional ---                           [phone], Endoscopic retrograde                            cholangiopancreatography (ERCP); with placement of                            endoscopic stent into biliary or pancreatic duct,                            including pre- and post-dilation and guide wire                            passage, when performed, including sphincterotomy,                            when performed, each stent  [id number], 59, Endoscopic retrograde                            cholangiopancreatography (ERCP); with                            sphincterotomy/papillotomy Diagnosis Code(s):        --- Professional ---                           K83.8, Other specified diseases of biliary tract                           R17, Unspecified jaundice                           R74.8, Abnormal levels of other serum enzymes CPT copyright 2017 American Medical Association. All rights reserved. The codes documented in this report are preliminary and upon coder review may  be revised to meet current compliance requirements. Arta Silence, MD 09/13/2017 1:03:51 PM This report has been signed electronically. Otis Brace, MD Number of Addenda: 0

## 2017-09-13 NOTE — Brief Op Note (Signed)
09/09/2017 - 09/13/2017  1:40 PM  PATIENT:  Randy Nielsen  67 y.o. male  PRE-OPERATIVE DIAGNOSIS:  jaundice  POST-OPERATIVE DIAGNOSIS:  pancreatic stent placed, used 44 hydratomme, 30 tome no wire, used 2 extra wires, needle knife, unable to cannulate CBD  PROCEDURE:  Procedure(s): ENDOSCOPIC RETROGRADE CHOLANGIOPANCREATOGRAPHY (ERCP) with possible stent placement (N/A)  SURGEON:  Surgeon(s) and Role:    Arta Silence, MD - Primary       [name]  MD   Findings ---------- - ERCP was aborted because of difficult common bile duct cannulation. PD was cannulated and subsequently pancreatic stent was placed. - recommend interventional radiology consult for liver biopsy to confirm the diagnosis. - consider ERCP with Dr.  Watt Climes sometime next week.  - Monitor CBC and CMP. - GI will follow.   Otis Brace MD, FACP 09/13/2017, 1:42 PM  Contact #  [phone]

## 2017-09-13 NOTE — Progress Notes (Signed)
[facility] Gastroenterology Progress Note  Randy Nielsen 67 y.o. Jun 27, 1950  CC:  jaundice   Subjective: no acute issues overnight. Alert oriented 3 at this time.    Objective: Vital signs in last 24 hours: Vitals:   09/13/17 0555 09/13/17 1023  BP: (!) 87/60 (!) 92/59  Pulse: 79   Resp: 12 11  Temp: 99.6 F (37.6 C) 98.4 F (36.9 C)  SpO2: 100% 100%    Physical Exam:  Constitutional: He is oriented to person, place, and time. No distress.  Cachectic-appearing patient. Not in acute distress  Eyes:  Scleral icterus is present.  Cardiovascular: Normal rate and regular rhythm. Murmur heard. Pulmonary/Chest: Effort normal and breath sounds normal. No respiratory distress.  Abdominal: Bowel sounds are normal. He exhibits distension. There is no rebound and no guarding.  Significantly distended abdomen with generalized discomfort on palpation  Musculoskeletal: Normal range of motion.  No edema      Lab Results: Recent Labs    09/12/17 0358 09/13/17 0643  NA 137 140  K 2.6* 3.7  CL 107 108  CO2 21* 22  GLUCOSE 92 90  BUN 38* 26*  CREATININE 1.75* 1.39*  CALCIUM 8.0* 8.2*  MG 1.9  --    Recent Labs    09/12/17 0358 09/13/17 0643  AST 96* 96*  ALT 42 43  ALKPHOS 330* 385*  BILITOT 20.5* 24.1*  PROT 5.6* 5.8*  ALBUMIN 2.2* 1.9*   Recent Labs    09/13/17 0643  WBC 7.2  HGB 8.8*  HCT 25.8*  MCV 95.6  PLT 66*   Recent Labs    09/11/17 0448 09/13/17 0643  LABPROT 18.8* 17.7*  INR 1.59 1.47      Assessment/Plan: - abnormal LFTs with jaundice.  CT scan showing severe biliary ductal dilatation with abrupt tapering of the common bile duct at the pancreatic head. 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 - multiple liver lesions. - clinical picture also concerning for cirrhosis. MELD 34. Ascites with low  fluid albumin. Low serum albumin. Thrombocytopenia. Elevated INR, - coagulopathy. INR 2.45 on  presentation.improving. INR 1.47 now. - Mild elevated troponins. Most likely supply demand mismatch. Patient denies any active chest pain. - acute kidney injury. Creatinine improving with IV albumin. - Right liver lobe lesion of around 6.8 cm. Concerning for cholangiocarcinoma. - Ascites.negative for SBP.  fluid albumin less than 1. - Weight loss - positive hepatitis C antibody.   Recommendations ------------------------ - significantly elevated CA 19-9 @ 7456. 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 MRI MRCP Showed significant dilatation of common bile duct to 2.2 cm in the head of the pancreas with the "string sign" at the ampulla. Concerning for pancreatic head lesion causing abrupt narrowing of one bile duct at the level of ampulla.  - ERCP with possible stent placement today. Risks, benefits and alternatives discussed with the patient today. Risk includes but not limited to infection, bleeding, perforation and post-ERCP pancreatitis.  - May consider liver biopsy of large liver lesion. - Follow HCV PCR . AFP normal.  - palliative care following. Appreciate input. - Currently on Zosyn. - Gi will follow.   Otis Brace MD, [place] 09/13/2017, 11:02 AM  Contact #  [phone]

## 2017-09-13 NOTE — Anesthesia Postprocedure Evaluation (Signed)
Anesthesia Post Note  Patient: Randy Nielsen  Procedure(s) Performed: ENDOSCOPIC RETROGRADE CHOLANGIOPANCREATOGRAPHY (ERCP) with possible stent placement (N/A )     Patient location during evaluation: PACU Anesthesia Type: General Level of consciousness: awake and alert Pain management: pain level controlled Vital Signs Assessment: post-procedure vital signs reviewed and stable Respiratory status: spontaneous breathing, nonlabored ventilation, respiratory function stable and patient connected to nasal cannula oxygen Cardiovascular status: blood pressure returned to baseline and stable Postop Assessment: no apparent nausea or vomiting Anesthetic complications: no    Last Vitals:  Vitals:   09/13/17 1400 09/13/17 1405  BP: (!) 85/53 (!) 88/56  Pulse: 65 63  Resp: 13 10  Temp:    SpO2: 100% 100%    Last Pain:  Vitals:   09/13/17 1410  TempSrc:   PainSc: 0-No pain                 Emiley Digiacomo

## 2017-09-13 NOTE — Anesthesia Preprocedure Evaluation (Addendum)
Anesthesia Evaluation  Patient identified by MRN, date of birth, ID band Patient awake    Reviewed: Allergy & Precautions, H&P , NPO status , Patient's Chart, lab work & pertinent test results, reviewed documented beta blocker date and time   Airway Mallampati: II  TM Distance: >3 FB Neck ROM: full    Dental no notable dental hx.    Pulmonary neg pulmonary ROS,    Pulmonary exam normal breath sounds clear to auscultation       Cardiovascular Exercise Tolerance: Good negative cardio ROS   Rhythm:regular Rate:Normal + Systolic murmurs    Neuro/Psych negative neurological ROS  negative psych ROS   GI/Hepatic negative GI ROS, Neg liver ROS, Obstructive jaundice with pancreatitis concerning for pancreatic mass versus stricture versus obstructive stone   Marked ascites    Endo/Other  diabetes, Type 2  Renal/GU ARFRenal disease  negative genitourinary   Musculoskeletal negative musculoskeletal ROS (+)   Abdominal   Peds  Hematology  (+) anemia ,   Anesthesia Other Findings   Reproductive/Obstetrics negative OB ROS                            Anesthesia Physical Anesthesia Plan  ASA: IV  Anesthesia Plan: General   Post-op Pain Management:    Induction:   PONV Risk Score and Plan: 2 and Treatment may vary due to age or medical condition  Airway Management Planned: Oral ETT  Additional Equipment:   Intra-op Plan:   Post-operative Plan: Extubation in OR  Informed Consent: I have reviewed the patients History and Physical, chart, labs and discussed the procedure including the risks, benefits and alternatives for the proposed anesthesia with the patient or authorized representative who has indicated his/her understanding and acceptance.   Dental Advisory Given  Plan Discussed with: CRNA, Anesthesiologist and Surgeon  Anesthesia Plan Comments:        Anesthesia Quick  Evaluation

## 2017-09-13 NOTE — Anesthesia Procedure Notes (Signed)
Procedure Name: Intubation Date/Time: 09/13/2017 11:18 AM Performed by: Jenne Campus, CRNA Pre-anesthesia Checklist: Patient identified, Emergency Drugs available, Suction available and Patient being monitored Patient Re-evaluated:Patient Re-evaluated prior to induction Oxygen Delivery Method: Circle System Utilized Preoxygenation: Pre-oxygenation with 100% oxygen Induction Type: IV induction Ventilation: Mask ventilation without difficulty Laryngoscope Size: Miller and 2 Grade View: Grade I Tube type: Oral Tube size: 7.5 mm Number of attempts: 1 Airway Equipment and Method: Stylet and Oral airway Placement Confirmation: ETT inserted through vocal cords under direct vision,  positive ETCO2 and breath sounds checked- equal and bilateral Secured at: 22 cm Tube secured with: Tape Dental Injury: Teeth and Oropharynx as per pre-operative assessment

## 2017-09-13 NOTE — Progress Notes (Addendum)
[facility] [facility] Liaison:  RN visit at 300pm  Notified by Jackelyn Poling [facility], of patient/family request for Hospice and [facility] services at home after discharge.  Chart and patient information have been reviewed with a [place].  Hospice eligibility approved by Dr. Loletha Grayer. [place].  Writer spoke with patient at bedside, and girlfriend, Kalman Shan, over the phone to initiate education related to hospice philosophy, services and team approach to care.  Patient/family verbalized understanding of information given.  Per discussion, plan is for discharge to home by [place] possibly over the weekend.  Please send signed and completed DNR form home with patient/family.  Patient will need prescriptions for discharge comfort medications.  DME needs have been discussed, patient currently has the following equipment in the home:  [facility].  Patient/family requests the following DME for delivery to the home:  walker.  [facility] equipment manager, Frann Rider, has been notified and will contact [place] to arrange delivery to the home.  The home address has been verified and is correct in the chart.  [name] at [phone] is the family member to contact to arrange time of delivery.  [facility] is aware of the above.  Completed discharge summary will need to be faxed to [facility] at [phone], when final.  Please notify [facility] when patient is ready to leave the unit at discharge.  (Call [phone] or [phone] if its after 5pm.)  [facility] information and contact numbers have been given to patient during visit.  Above information shared with Jackelyn Poling, [facility].  Please call with any hospice related questions.  Thank you for the referral.   Edyth Gunnels, RN, [facility] Liaison [phone]  All hospital liaisons are now on [place].

## 2017-09-13 NOTE — Transfer of Care (Signed)
Immediate Anesthesia Transfer of Care Note  Patient: Randy Nielsen  Procedure(s) Performed: ENDOSCOPIC RETROGRADE CHOLANGIOPANCREATOGRAPHY (ERCP) with possible stent placement (N/A )  Patient Location: Endoscopy Unit  Anesthesia Type:General  Level of Consciousness: oriented, drowsy and patient cooperative  Airway & Oxygen Therapy: Patient Spontanous Breathing and Patient connected to nasal cannula oxygen  Post-op Assessment: Report given to RN and Post -op Vital signs reviewed and stable  Post vital signs: Reviewed  Last Vitals:  Vitals Value Taken Time  BP 97/55 09/13/2017  1:20 PM  Temp    Pulse 67 09/13/2017  1:30 PM  Resp 11 09/13/2017  1:30 PM  SpO2 100 % 09/13/2017  1:30 PM  Vitals shown include unvalidated device data.  Last Pain:  Vitals:   09/13/17 1320  TempSrc:   PainSc: 0-No pain         Complications: No apparent anesthesia complications

## 2017-09-13 NOTE — Progress Notes (Addendum)
Patient off unit, could not consult at this time or home hospice referral. Will continue to follow and re attempt at a later time.

## 2017-09-14 LAB — GLUCOSE, CAPILLARY
GLUCOSE-CAPILLARY: 113 mg/dL — AB (ref 65–99)
Glucose-Capillary: 105 mg/dL — ABNORMAL HIGH (ref 65–99)
Glucose-Capillary: 107 mg/dL — ABNORMAL HIGH (ref 65–99)
Glucose-Capillary: 109 mg/dL — ABNORMAL HIGH (ref 65–99)
Glucose-Capillary: 90 mg/dL (ref 65–99)

## 2017-09-14 MED ORDER — SODIUM CHLORIDE 0.9 % IV BOLUS
250.0000 mL | Freq: Once | INTRAVENOUS | Status: AC
  Administered 2017-09-14: 250 mL via INTRAVENOUS

## 2017-09-14 MED ORDER — FUROSEMIDE 40 MG PO TABS: 40.0000 mg | ORAL_TABLET | Freq: Every day | ORAL | Status: DC

## 2017-09-14 MED ORDER — ALBUMIN HUMAN 25 % IV SOLN
25.0000 g | Freq: Once | INTRAVENOUS | Status: AC
  Administered 2017-09-14: 25 g via INTRAVENOUS
  Filled 2017-09-14: qty 100

## 2017-09-14 MED ORDER — FUROSEMIDE 10 MG/ML IJ SOLN: 20.0000 mg | Freq: Once | INTRAMUSCULAR | Status: DC

## 2017-09-14 MED ORDER — HYDROCODONE-ACETAMINOPHEN 5-325 MG PO TABS
1.0000 | ORAL_TABLET | Freq: Four times a day (QID) | ORAL | Status: DC | PRN
  Administered 2017-09-14: 1 via ORAL
  Filled 2017-09-14: qty 1

## 2017-09-14 NOTE — Progress Notes (Signed)
Subjective: Increase abdominal distention over past few days. No abdominal pain.  Objective: Vital signs in last 24 hours: Temp:  [97.8 F (36.6 C)-99.3 F (37.4 C)] 99.3 F (37.4 C) (05/11 0450) Pulse Rate:  [62-74] 72 (05/11 0450) Resp:  [10-16] 16 (05/11 0450) BP: (77-98)/(49-67) 92/62 (05/11 0450) SpO2:  [98 %-100 %] 100 % (05/11 0450) Weight:  [64.4 kg (142 lb)-66.1 kg (145 lb 11.6 oz)] 66.1 kg (145 lb 11.6 oz) (05/11 0609) Weight change: -0.189 kg (-6.7 oz) Last BM Date: 09/12/17  PE: GEN:  Cachectice, older-appearing than stated age ABD:  Moderately tense ascites, non-tender  Lab Results: CBC    Component Value Date/Time   WBC 7.2 09/13/2017 0643   RBC 2.70 (L) 09/13/2017 0643   HGB 8.8 (L) 09/13/2017 0643   HCT 25.8 (L) 09/13/2017 0643   PLT 66 (L) 09/13/2017 0643   MCV 95.6 09/13/2017 0643   MCH 32.6 09/13/2017 0643   MCHC 34.1 09/13/2017 0643   RDW 15.7 (H) 09/13/2017 0643   LYMPHSABS 1.8 09/10/2017 0418   MONOABS 0.5 09/10/2017 0418   EOSABS 0.0 09/10/2017 0418   BASOSABS 0.0 09/10/2017 0418   CMP     Component Value Date/Time   NA 140 09/13/2017 0643   K 3.7 09/13/2017 0643   CL 108 09/13/2017 0643   CO2 22 09/13/2017 0643   GLUCOSE 90 09/13/2017 0643   BUN 26 (H) 09/13/2017 0643   CREATININE 1.39 (H) 09/13/2017 0643   CALCIUM 8.2 (L) 09/13/2017 0643   PROT 5.8 (L) 09/13/2017 0643   ALBUMIN 1.9 (L) 09/13/2017 0643   AST 96 (H) 09/13/2017 0643   ALT 43 09/13/2017 0643   ALKPHOS 385 (H) 09/13/2017 0643   BILITOT 24.1 ([place]) 09/13/2017 0643   GFRNONAA 51 (L) 09/13/2017 0643   GFRAA 59 (L) 09/13/2017 5400   Assessment:  1.  Obstructive jaundice.  Likely malignancy. 2.  Ascites.  From portal hypertension versus malignancy or both; ascites negative SBP. 3.  Cirrhosis on imaging. 4.  Liver mass.  Plan:  1. Patient has poor functional status and overall constellation of findings are harbingers of very poor prognosis. 2.  Palliative care note  yesterday implies that home hospice with no further work-up is planned; I agree with this approach. However, when I talked to patient and his friend today, it was not clear to me that this was their understanding. 3.  If further work-up planned, then would consider paracentesis (diagnostic and therapeutic), liver biopsy and possible retry ERCP (unsuccessful ERCP yesterday); however, I don't think any of these interventions will appreciably help or alter patient's dismal prognosis. 4.  Howie Ill will sign-off; please call with questions; thank you for the consult.   Landry Dyke 09/14/2017, 8:20 AM   Cell [phone] If no answer or after 5 PM call [phone]

## 2017-09-14 NOTE — Progress Notes (Signed)
MD paged about patient's blood pressure being 74/49, asked whether to hold IV lasix. MD said to hold lasix and she will order albumin.

## 2017-09-14 NOTE — Progress Notes (Signed)
[facility]                                                                              Patient Demographics  Randy Nielsen, is a 67 y.o. male, DOB - 1950/11/21, UVO:[id number]  Admit date - 09/09/2017   Admitting Physician Rise Patience, MD  Outpatient Primary MD for the patient is [name], MD  Outpatient specialists:   LOS - 4  days   Medical records reviewed and are as summarized below:    Chief Complaint  Patient presents with  . Weakness       Brief summary   66 y.o.malewithhistory of diabetes mellitus, hypertension previous pancreatitis and pancreatic mass has not been to a doctor for many years admitted on 09/10/17 with obstructive jaundice after presenting with abdominal pain and distention .  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 Alessandra Bevels.  Palliative care input appreciated, patient DNR with limitations.  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 -CT abdomen pelvis with possible cholangiocarcinoma, MRCP showed dramatic dilatation of intra-and extrahepatic bile ducts with marked distention of gallbladder, CBD 2.2 cm, pancreatic head lesion causing obstruction, recommended ERCP/EUS.  Innumerable liver lesions compatible with metastatic disease. -Elevated CA-19-9 [id number], normal AFP, elevated bilirubin 24.1, trending up, transaminitis with obstructive pattern - Palliative care consulted.  Recommendation for home hospice, overall poor prognosis, hypotensive -Ultrasound-guided paracentesis, therapeutic and palliative approach.  -Discussed with IR, given palliative approach, did  not recommend liver biopsy and very poor functional status and prognosis  Active Problems:    Ascites -Status post ultrasound-guided paracentesis on 5/7, 2.5 L ascitic fluid removed, studies negative for SBP -Significant distention, patient reports abdomen tense and feels uncomfortable - Continue IV Zosyn -Ordered ultrasound-guided therapeutic paracentesis with albumin -Unable to give diuretics due to hypotension  Hypotension -Likely due to #1, hepatorenal syndrome -Holding IV fluids due to worsening abdominal distention, give albumin x1 today 25g    ARF (acute renal failure) (HCC) -Likely due to #1, possible hepatorenal syndrome, continue to avoid nephrotoxic agents,  -Creatinine improving 1.39 on 5/10    Diabetes mellitus type 2 in nonobese (HCC) Hemoglobin A1c 5.2, continue sliding scale insulin-   Anemia, thrombocytopenia -Likely due to #1, transfuse as indicated for hemoglobin less than 8  Goals of care -Palliative care following, patient and family not interested in liver biopsy or oncological intervention, palliative ERCP with stent placement today.  Severe protein calorie malnutrition Continue nutritional supplements   Code Status: DNR DVT Prophylaxis: SCD Family Communication: Discussed in detail with the patient, all imaging results, lab results explained to the patient, family member in the room   Disposition Plan: Paracentesis today, possible DC home with hospice if family agrees in a.m.  Time Spent in minutes 25 minutes  Procedures:  MRCP  Consultants:   GI Palliative Interventional radiology  Antimicrobials:   IV  Zosyn   Medications  Scheduled Meds: Continuous Infusions: . piperacillin-tazobactam (ZOSYN)  IV 3.375 g (09/14/17 0257)  . sodium chloride     PRN Meds:.acetaminophen **OR** acetaminophen, lidocaine, ondansetron **OR** ondansetron (ZOFRAN) IV   Antibiotics   Anti-infectives (From admission, onward)   Start     Dose/Rate Route  Frequency Ordered Stop   09/12/17 1900  piperacillin-tazobactam (ZOSYN) IVPB 3.375 g     3.375 g 12.5 mL/hr over 240 Minutes Intravenous Every 8 hours 09/12/17 1218     09/10/17 0800  piperacillin-tazobactam (ZOSYN) IVPB 3.375 g  Status:  Discontinued     3.375 g 12.5 mL/hr over 240 Minutes Intravenous Every 8 hours 09/10/17 0739 09/12/17 1218   09/10/17 0430  cefTRIAXone (ROCEPHIN) 2 g in sodium chloride 0.9 % 100 mL IVPB  Status:  Discontinued     2 g 200 mL/hr over 30 Minutes Intravenous Every 24 hours 09/10/17 0419 09/10/17 0739        Subjective:   Randy Nielsen was seen and examined today.  Complaining of abdominal discomfort, abdomen distended.  Low-grade temp of 99.3 F, no nausea vomiting. Patient denies dizziness, chest pain, shortness of breath, new weakness, numbess, tingling.   Objective:   Vitals:   09/13/17 2036 09/14/17 0450 09/14/17 0609 09/14/17 1049  BP: 97/67 92/62  (!) 74/49  Pulse: 66 72  68  Resp: 16 16    Temp: 98.1 F (36.7 C) 99.3 F (37.4 C)    TempSrc: Oral Oral    SpO2: 100% 100%    Weight:   66.1 kg (145 lb 11.6 oz)   Height:        Intake/Output Summary (Last 24 hours) at 09/14/2017 1110 Last data filed at 09/14/2017 0600 Gross per 24 hour  Intake 4482.13 ml  Output 1 ml  Net 4481.13 ml     Wt Readings from Last 3 Encounters:  09/14/17 66.1 kg (145 lb 11.6 oz)     Exam   General: Alert and oriented x 3, NAD, frail and ill-appearing  Eyes: icteric Sclera,  HEENT:    Cardiovascular: S1 S2 auscultated, no rubs, murmurs or gallops. Regular rate and rhythm. No pedal edema b/l  Respiratory: Clear to auscultation bilaterally, no wheezing, rales or rhonchi  Gastrointestinal: Soft, tender, massively distended, ascites   Ext: no pedal edema bilaterally  Neuro: no new deficits  Musculoskeletal: No digital cyanosis, clubbing  Skin: No rashes  Psych: uncomfortable   Data Reviewed:  I have personally reviewed following labs and  imaging studies  Micro Results Recent Results (from the past 240 hour(s))  Body fluid culture     Status: None   Collection Time: 09/10/17 11:42 AM  Result Value Ref Range Status   Specimen Description PERITONEAL CAVITY  Final   Special Requests NONE  Final   Gram Stain   Final    RARE WBC PRESENT, PREDOMINANTLY MONONUCLEAR NO ORGANISMS SEEN    Culture   Final    NO GROWTH 3 DAYS Performed at [facility], [address] [place], [place] [address]    Report Status 09/13/2017 FINAL  Final    Radiology Reports Ct Abdomen Pelvis Wo Contrast  Result Date: 09/10/2017 CLINICAL DATA:  67 y/o M; weakness, abdominal pain, swelling, jaundice, nausea, and not eating. EXAM: CT ABDOMEN AND PELVIS WITHOUT CONTRAST TECHNIQUE: Multidetector CT imaging of the abdomen and pelvis was performed following the standard protocol without IV contrast. COMPARISON:  11/16/2007 CT of abdomen and pelvis FINDINGS: Lower chest:  Small left pleural effusion. Hepatobiliary: Severe intra and extrahepatic biliary ductal dilatation and distention of the gallbladder. Small gallstones. The common bile duct is dilated to the pancreatic head where it abruptly tapers (series 6, image 78). Common bile duct measures up to 3.3 cm in diameter. Multiple hypoattenuating pancreatic lesions, several with coarse calcifications, the largest in the right lobe of the liver peripherally spanning 6.8 x 6.0 cm (series 3, image 21). The large right lobe of liver lesion has a suggestion of capsular retraction. Pancreas: No significant dilatation of the pancreatic duct. No peripancreatic inflammatory change. Spleen: Normal in size without focal abnormality. Adrenals/Urinary Tract: Adrenal glands are unremarkable. Kidneys are normal, without renal calculi, focal lesion, or hydronephrosis. Bladder is unremarkable. Stomach/Bowel: Stomach is within normal limits. Appendix not identified. No evidence of bowel wall thickening, distention, or  inflammatory changes. Vascular/Lymphatic: Aortic atherosclerosis. No enlarged abdominal or pelvic lymph nodes. Reproductive: Prostate is unremarkable. Other: Large volume of peritoneal ascites. Musculoskeletal: No fracture is seen. IMPRESSION: 1. Severe intra and extrahepatic biliary ductal dilatation and distention of the gallbladder. Abrupt tapering of the common bile duct within pancreatic head may represent underlying mass, stricture, or radiolucent stone. 2. Large volume of peritoneal ascites. 3. Ill-defined hypoattenuating lesions within the liver with coarse calcifications measuring up to 6.8 cm in the right lobe of the liver. Findings of capsular retraction suggest cholangiocarcinoma. 4. When the patient is clinically stable and able to follow directions and hold their breath further evaluation with dedicated abdominal MRI should be considered. Electronically Signed   By: Kristine Garbe M.D.   On: 09/10/2017 03:00   Mr Abdomen Mrcp Wo Contrast  Result Date: 09/12/2017 CLINICAL DATA:  Biliary dilatation. EXAM: MRI ABDOMEN WITHOUT CONTRAST  (INCLUDING MRCP) TECHNIQUE: Multiplanar multisequence MR imaging of the abdomen was performed. Heavily T2-weighted images of the biliary and pancreatic ducts were obtained, and three-dimensional MRCP images were rendered by post processing. COMPARISON:  CT scan 09/09/2017 FINDINGS: Lower chest: Small left pleural effusion with dependent atelectasis. Hepatobiliary: Innumerable liver lesions are identified in both hepatic lobes. Most of these measure in the 10-15 mm size range although a large 5.6 x 8.9 cm lesions identified at the junction of segments VII and VIII. 3.5 x 3.7 cm lesion is identified in segment IV. Gallbladder is markedly distended. There is dramatic intra and extrahepatic biliary duct dilatation. Common bile duct is dilated up to 2.2 cm with relatively abrupt tapering in the head of the pancreas with about 10-15 mm of "string sign" proximal to  the ampulla. This area is not well evaluated due to motion artifact. No obstructing mass lesion is evident although exam is limited by motion artifact and lack of intravenous contrast material. Pancreas: Not well seen due to motion artifact. No substantial dilatation of the main pancreatic duct. Spleen:  Unremarkable. Adrenals/Urinary Tract: No adrenal nodule or mass. Small cysts are noted in the kidneys bilaterally. Stomach/Bowel: Stomach is nondilated. No bowel dilatation evident within the visualized abdomen. Vascular/Lymphatic: No abdominal aortic aneurysm. Flow void in the portal vein and superior mesenteric vein suggests patency. No bulky lymphadenopathy evident although assessment limited by lack of contrast material. Other:  Large volume ascites. Musculoskeletal: Body wall edema. No abnormal marrow signal evident. IMPRESSION: 1. Relatively dramatic dilatation of the intra and extrahepatic bile ducts with marked distention of the gallbladder. Common bile duct is 2.2 cm in the head of the pancreas in tapers down to what appears to be a 10-15 mm "string sign" tracking into the ampulla.  This region of the distal duct is not well evaluated due to lack of contrast and motion artifact. Imaging appearance does raise the question of a pancreatic head lesion causing obstruction. No evidence for cholelithiasis or choledocholithiasis. ERCP/EUS would likely prove helpful to further evaluate. 2. Innumerable liver lesions ranging in size from 10 mm up to 9 cm. Imaging features compatible with metastatic disease and 1 of the larger lesions may represent primary source. 3. Large volume ascites 4. Body wall edema Electronically Signed   By: Misty Stanley M.D.   On: 09/12/2017 13:36   Mr 3d Recon At Scanner  Result Date: 09/12/2017 CLINICAL DATA:  Biliary dilatation. EXAM: MRI ABDOMEN WITHOUT CONTRAST  (INCLUDING MRCP) TECHNIQUE: Multiplanar multisequence MR imaging of the abdomen was performed. Heavily T2-weighted images  of the biliary and pancreatic ducts were obtained, and three-dimensional MRCP images were rendered by post processing. COMPARISON:  CT scan 09/09/2017 FINDINGS: Lower chest: Small left pleural effusion with dependent atelectasis. Hepatobiliary: Innumerable liver lesions are identified in both hepatic lobes. Most of these measure in the 10-15 mm size range although a large 5.6 x 8.9 cm lesions identified at the junction of segments VII and VIII. 3.5 x 3.7 cm lesion is identified in segment IV. Gallbladder is markedly distended. There is dramatic intra and extrahepatic biliary duct dilatation. Common bile duct is dilated up to 2.2 cm with relatively abrupt tapering in the head of the pancreas with about 10-15 mm of "string sign" proximal to the ampulla. This area is not well evaluated due to motion artifact. No obstructing mass lesion is evident although exam is limited by motion artifact and lack of intravenous contrast material. Pancreas: Not well seen due to motion artifact. No substantial dilatation of the main pancreatic duct. Spleen:  Unremarkable. Adrenals/Urinary Tract: No adrenal nodule or mass. Small cysts are noted in the kidneys bilaterally. Stomach/Bowel: Stomach is nondilated. No bowel dilatation evident within the visualized abdomen. Vascular/Lymphatic: No abdominal aortic aneurysm. Flow void in the portal vein and superior mesenteric vein suggests patency. No bulky lymphadenopathy evident although assessment limited by lack of contrast material. Other:  Large volume ascites. Musculoskeletal: Body wall edema. No abnormal marrow signal evident. IMPRESSION: 1. Relatively dramatic dilatation of the intra and extrahepatic bile ducts with marked distention of the gallbladder. Common bile duct is 2.2 cm in the head of the pancreas in tapers down to what appears to be a 10-15 mm "string sign" tracking into the ampulla. This region of the distal duct is not well evaluated due to lack of contrast and motion  artifact. Imaging appearance does raise the question of a pancreatic head lesion causing obstruction. No evidence for cholelithiasis or choledocholithiasis. ERCP/EUS would likely prove helpful to further evaluate. 2. Innumerable liver lesions ranging in size from 10 mm up to 9 cm. Imaging features compatible with metastatic disease and 1 of the larger lesions may represent primary source. 3. Large volume ascites 4. Body wall edema Electronically Signed   By: Misty Stanley M.D.   On: 09/12/2017 13:36   Dg Chest Port 1 View  Result Date: 09/10/2017 CLINICAL DATA:  Abdominal pain and swelling for 2 days EXAM: PORTABLE CHEST 1 VIEW COMPARISON:  None. FINDINGS: The lungs are not optimally aerated, with some elevation of the right hemidiaphragm and mild volume loss at the left lung base. However no definite pneumonia is seen and no pleural effusion is noted. Mediastinal and hilar contours are unremarkable and the heart is within upper limits of normal. No  bony abnormality is seen. IMPRESSION: Poor inspiration with mild volume loss at the lung bases. No definite active process. Electronically Signed   By: Ivar Drape M.D.   On: 09/10/2017 08:09   Ir Paracentesis  Result Date: 09/10/2017 INDICATION: Patient with history of abdominal pain, jaundice, elevated liver function tests/creatinine, intra/extrahepatic biliary ductal dilatation, liver lesions, ascites. Request made for diagnostic and therapeutic paracentesis. EXAM: ULTRASOUND GUIDED DIAGNOSTIC AND THERAPEUTIC PARACENTESIS MEDICATIONS: None COMPLICATIONS: None immediate. PROCEDURE: Informed written consent was obtained from the patient after a discussion of the risks, benefits and alternatives to treatment. A timeout was performed prior to the initiation of the procedure. Initial ultrasound scanning demonstrates a moderate amount of ascites within the left lower abdominal quadrant. The left lower abdomen was prepped and draped in the usual sterile fashion. 2%  lidocaine was used for local anesthesia. Following this, a 6 Fr Safe-T-Centesis catheter was introduced. An ultrasound image was saved for documentation purposes. The paracentesis was performed. The catheter was removed and a dressing was applied. The patient tolerated the procedure well without immediate post procedural complication. FINDINGS: A total of approximately 2.5 liters of golden yellow fluid was removed. Samples were sent to the laboratory as requested by the clinical team. IMPRESSION: Successful ultrasound-guided diagnostic and therapeutic paracentesis yielding 2.5 liters of peritoneal fluid. Read by: Rowe Robert, PA-C Electronically Signed   By: Jerilynn Mages.  Shick M.D.   On: 09/10/2017 12:05    Lab Data:  CBC: Recent Labs  Lab 09/09/17 2110 09/10/17 0418 09/13/17 0643  WBC 15.9* 11.3* 7.2  NEUTROABS  --  9.0*  --   HGB 11.9* 10.5* 8.8*  HCT 33.9* 30.4* 25.8*  MCV 94.7 95.0 95.6  PLT 136* 114* 66*   Basic Metabolic Panel: Recent Labs  Lab 09/09/17 2110 09/10/17 0418 09/11/17 0448 09/12/17 0358 09/13/17 0643  NA 136 136 137 137 140  K 3.7 3.6 3.3* 2.6* 3.7  CL 103 106 108 107 108  CO2 19* 17* 16* 21* 22  GLUCOSE 65 68 71 92 90  BUN 52* 51* 55* 38* 26*  CREATININE 2.36* 2.45* 2.46* 1.75* 1.39*  CALCIUM 8.2* 7.8* 7.8* 8.0* 8.2*  MG  --   --   --  1.9  --    GFR: Estimated Creatinine Clearance: 47.2 mL/min (A) (by C-G formula based on SCr of 1.39 mg/dL (H)). Liver Function Tests: Recent Labs  Lab 09/09/17 2233 09/10/17 0418 09/11/17 0448 09/12/17 0358 09/13/17 0643  AST 152* 143* 127* 96* 96*  ALT 55 49 49 42 43  ALKPHOS 517* 483* 454* 330* 385*  BILITOT 18.1* 16.7* 18.5* 20.5* 24.1*  PROT 7.1 6.4* 5.8* 5.6* 5.8*  ALBUMIN 1.2* 1.1* 1.4* 2.2* 1.9*   Recent Labs  Lab 09/09/17 2233  LIPASE 810*   No results for input(s): AMMONIA in the last 168 hours. Coagulation Profile: Recent Labs  Lab 09/10/17 0313 09/11/17 0448 09/13/17 0643  INR 2.45 1.59 1.47    Cardiac Enzymes: Recent Labs  Lab 09/10/17 0847 09/11/17 0448  TROPONINI 0.30* 0.08*   BNP (last 3 results) No results for input(s): PROBNP in the last 8760 hours. HbA1C: No results for input(s): HGBA1C in the last 72 hours. CBG: Recent Labs  Lab 09/13/17 1403 09/13/17 2038 09/13/17 2343 09/14/17 0449 09/14/17 0753  GLUCAP 84 100* 107* 109* 105*   Lipid Profile: No results for input(s): CHOL, HDL, LDLCALC, TRIG, CHOLHDL, LDLDIRECT in the last 72 hours. Thyroid Function Tests: No results for input(s): TSH, T4TOTAL, FREET4, T3FREE, THYROIDAB  in the last 72 hours. Anemia Panel: No results for input(s): VITAMINB12, FOLATE, FERRITIN, TIBC, IRON, RETICCTPCT in the last 72 hours. Urine analysis:    Component Value Date/Time   COLORURINE AMBER (A) 09/09/2017 0306   APPEARANCEUR HAZY (A) 09/09/2017 0306   LABSPEC 1.020 09/09/2017 0306   PHURINE 5.0 09/09/2017 0306   GLUCOSEU NEGATIVE 09/09/2017 0306   HGBUR NEGATIVE 09/09/2017 0306   BILIRUBINUR MODERATE (A) 09/09/2017 0306   KETONESUR NEGATIVE 09/09/2017 0306   PROTEINUR 30 (A) 09/09/2017 0306   UROBILINOGEN 1.0 11/08/2007 0028   NITRITE NEGATIVE 09/09/2017 0306   LEUKOCYTESUR NEGATIVE 09/09/2017 0306     Marayah Higdon M.D. [facility] 09/14/2017, 11:10 AM  Pager: [phone] Between 7am to 7pm - call Pager - [phone]  After 7pm go to [email or web address] - password [email or web address]  Call night coverage person covering after 7pm

## 2017-09-15 ENCOUNTER — Encounter (HOSPITAL_COMMUNITY): Payer: Self-pay | Admitting: Gastroenterology

## 2017-09-15 ENCOUNTER — Inpatient Hospital Stay (HOSPITAL_COMMUNITY): Payer: Medicare Other

## 2017-09-15 LAB — CBC
HCT: 25.5 % — ABNORMAL LOW (ref 39.0–52.0)
Hemoglobin: 8.7 g/dL — ABNORMAL LOW (ref 13.0–17.0)
MCH: 33.5 pg (ref 26.0–34.0)
MCHC: 34.1 g/dL (ref 30.0–36.0)
MCV: 98.1 fL (ref 78.0–100.0)
PLATELETS: 77 10*3/uL — AB (ref 150–400)
RBC: 2.6 MIL/uL — ABNORMAL LOW (ref 4.22–5.81)
RDW: 15.6 % — AB (ref 11.5–15.5)
WBC: 8.4 10*3/uL (ref 4.0–10.5)

## 2017-09-15 LAB — BASIC METABOLIC PANEL
Anion gap: 12 (ref 5–15)
BUN: 17 mg/dL (ref 6–20)
CO2: 22 mmol/L (ref 22–32)
CREATININE: 1.26 mg/dL — AB (ref 0.61–1.24)
Calcium: 7.9 mg/dL — ABNORMAL LOW (ref 8.9–10.3)
Chloride: 104 mmol/L (ref 101–111)
GFR calc Af Amer: >60 mL/min (ref >60)
GFR, EST NON AFRICAN AMERICAN: 58 mL/min — AB (ref >60)
Glucose, Bld: 103 mg/dL — ABNORMAL HIGH (ref 65–99)
Potassium: 3.3 mmol/L — ABNORMAL LOW (ref 3.5–5.1)
SODIUM: 138 mmol/L (ref 135–145)

## 2017-09-15 LAB — ALBUMIN, PLEURAL OR PERITONEAL FLUID: Albumin, Fluid: <1 g/dL

## 2017-09-15 LAB — BODY FLUID CELL COUNT WITH DIFFERENTIAL
Eos, Fluid: 0 %
Lymphs, Fluid: 22 %
MONOCYTE-MACROPHAGE-SEROUS FLUID: 7 % — AB (ref 50–90)
Neutrophil Count, Fluid: 71 % — ABNORMAL HIGH (ref 0–25)
Total Nucleated Cell Count, Fluid: 229 cu mm (ref 0–1000)

## 2017-09-15 LAB — GLUCOSE, PLEURAL OR PERITONEAL FLUID: GLUCOSE FL: 102 mg/dL

## 2017-09-15 LAB — GLUCOSE, CAPILLARY
GLUCOSE-CAPILLARY: 66 mg/dL (ref 65–99)
GLUCOSE-CAPILLARY: 76 mg/dL (ref 65–99)
GLUCOSE-CAPILLARY: 89 mg/dL (ref 65–99)
Glucose-Capillary: 108 mg/dL — ABNORMAL HIGH (ref 65–99)
Glucose-Capillary: 94 mg/dL (ref 65–99)
Glucose-Capillary: 69 mg/dL (ref 65–99)

## 2017-09-15 LAB — GRAM STAIN

## 2017-09-15 LAB — PROTEIN, PLEURAL OR PERITONEAL FLUID

## 2017-09-15 LAB — LACTATE DEHYDROGENASE, PLEURAL OR PERITONEAL FLUID: LD, Fluid: 59 U/L — ABNORMAL HIGH (ref 3–23)

## 2017-09-15 MED ORDER — LIDOCAINE HCL (PF) 1 % IJ SOLN
INTRAMUSCULAR | Status: AC
  Filled 2017-09-15: qty 30

## 2017-09-15 NOTE — Progress Notes (Signed)
Hypoglycemic Event  CBG: 69  Treatment: 15 GM carbohydrate snack  Symptoms: None  Follow-up CBG: Time: 1315 CBG Result: 80  Possible Reasons for Event: Inadequate meal intake  Comments/MD notified:OJ was given.     Holley Raring

## 2017-09-15 NOTE — Progress Notes (Signed)
[facility])  Continue to follow in anticipation of admission to [facility] services at home after discharge.   [facility] RN liaison met with patient and spoke with [name] by phone 09/13/17 to confirm plan.   Please send signed and completed DNR home with patient at discharge. Please send scripts for any medication patient does not already having including symptom management.   [facility] referral center is aware of this patient and will arrange RN visit at home after discharge.   Please call with hospice related questions.   Thank you,  Erling Conte, LCSW [phone]

## 2017-09-15 NOTE — Progress Notes (Signed)
Hypoglycemic Event  CBG: 66  Treatment: 15 GM carbohydrate snack  Symptoms: None  Follow-up CBG: Time: 2:04 CBG Result:76  Possible Reasons for Event: Inadequate meal intake  Comments/MD notified: Bodenheimer, NP    Randy Nielsen Dravyn Severs

## 2017-09-15 NOTE — Progress Notes (Signed)
[facility]                                                                              Patient Demographics  Randy Nielsen, is a 67 y.o. male, DOB - 01-25-1951, HUT:[id number]  Admit date - 09/09/2017   Admitting Physician Rise Patience, MD  Outpatient Primary MD for the patient is [name], MD  Outpatient specialists:   LOS - 5  days   Medical records reviewed and are as summarized below:    Chief Complaint  Patient presents with  . Weakness       Brief summary   66 y.o.malewithhistory of diabetes mellitus, hypertension previous pancreatitis and pancreatic mass has not been to a doctor for many years admitted on 09/10/17 with obstructive jaundice after presenting with abdominal pain and distention .  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 Alessandra Bevels.  Palliative care input appreciated, patient DNR with limitations.  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 -CT abdomen pelvis with possible cholangiocarcinoma, MRCP showed dramatic dilatation of intra-and extrahepatic bile ducts with marked distention of gallbladder, CBD 2.2 cm, pancreatic head lesion causing obstruction, recommended ERCP/EUS.  Innumerable liver lesions compatible with metastatic disease. -Elevated CA-[id number], normal AFP, elevated bilirubin 24.1, trending up, transaminitis with obstructive pattern - Palliative care consulted.  Recommendation for home hospice, overall poor prognosis -Discussed with IR, given palliative approach, did not recommend liver biopsy and very poor functional status and prognosis.   Therapeutic paracentesis done today for comfort, 5.1 L removed, follow labs -Discussed with wife at the bedside who is confused about if repeat ERCP will be done.  Explained that GI does not think any of these interventions will alter patient's prognosis.  she is agreeable for hospice at home.   Active Problems:    Ascites -Status post ultrasound-guided paracentesis on 5/7, 2.5 L ascitic fluid removed, studies negative for SBP -For now continue IV Zosyn, follow lab -Unable to give diuretics due to hypotension, albumin given on 5/11 -Repeat paracentesis today  Hypotension -Likely due to #1, hepatorenal syndrome -BP somewhat stable today    ARF (acute renal failure) (HCC) -Likely due to #1, possible hepatorenal syndrome, continue to avoid nephrotoxic agents,  -Creatinine improving, 1.2    Diabetes mellitus type 2 in nonobese (HCC) Hemoglobin A1c 5.2, continue sliding scale insulin-   Anemia, thrombocytopenia -Likely due to #1, transfuse as indicated for hemoglobin less than 8  Goals of care -Palliative care following.  Case manager consulted for hospice at home.   Severe protein calorie malnutrition Continue nutritional supplements   Code Status: DNR DVT Prophylaxis: SCD Family Communication: Discussed in detail with the patient, all imaging results, lab results explained to the patient and wife at the bedside   Disposition Plan: Paracentesis done today, watch overnight, DC home in a.m. with hospice  Time Spent in minutes  25 minutes  Procedures:  MRCP Paracentesis x2  Consultants:   GI Palliative Interventional radiology  Antimicrobials:   IV Zosyn   Medications  Scheduled Meds: . lidocaine (PF)       Continuous Infusions: . piperacillin-tazobactam (ZOSYN)  IV Stopped (09/15/17 0852)   PRN Meds:.acetaminophen **OR** acetaminophen, HYDROcodone-acetaminophen, lidocaine, ondansetron **OR** ondansetron (ZOFRAN) IV   Antibiotics   Anti-infectives (From  admission, onward)   Start     Dose/Rate Route Frequency Ordered Stop   09/12/17 1900  piperacillin-tazobactam (ZOSYN) IVPB 3.375 g     3.375 g 12.5 mL/hr over 240 Minutes Intravenous Every 8 hours 09/12/17 1218     09/10/17 0800  piperacillin-tazobactam (ZOSYN) IVPB 3.375 g  Status:  Discontinued     3.375 g 12.5 mL/hr over 240 Minutes Intravenous Every 8 hours 09/10/17 0739 09/12/17 1218   09/10/17 0430  cefTRIAXone (ROCEPHIN) 2 g in sodium chloride 0.9 % 100 mL IVPB  Status:  Discontinued     2 g 200 mL/hr over 30 Minutes Intravenous Every 24 hours 09/10/17 0419 09/10/17 0739        Subjective:   Randy Nielsen was seen and examined today.  No complaints from the patient.  Overnight no acute issues.  BP improving.  No fevers.   Patient denies dizziness, chest pain, shortness of breath, new weakness, numbess, tingling.   Objective:   Vitals:   09/15/17 0844 09/15/17 0845 09/15/17 0855 09/15/17 0901  BP: (!) 89/69 99/62 97/67  94/61  Pulse:      Resp:      Temp:      TempSrc:      SpO2:      Weight:      Height:        Intake/Output Summary (Last 24 hours) at 09/15/2017 1115 Last data filed at 09/14/2017 1500 Gross per 24 hour  Intake 50 ml  Output -  Net 50 ml     Wt Readings from Last 3 Encounters:  09/14/17 66.1 kg (145 lb 11.6 oz)     Exam   General: Alert and oriented, frail, ill-appearing  Eyes: icteric Sclera,  HEENT:   Cardiovascular: S1 S2 auscultated, Regular rate and rhythm.   Respiratory: Decreased breath sound at the bases  Gastrointestinal: Soft, distended, nontender, NBS  Ext: no pedal edema bilaterally  Neuro: no new deficits  Musculoskeletal: No digital cyanosis, clubbing  Skin: No rashes  Psych: flat affect   Data Reviewed:  I have personally reviewed following labs and imaging studies  Micro Results Recent Results (from the past 240 hour(s))  Body fluid culture     Status: None   Collection Time: 09/10/17 11:42 AM  Result  Value Ref Range Status   Specimen Description PERITONEAL CAVITY  Final   Special Requests NONE  Final   Gram Stain   Final    RARE WBC PRESENT, PREDOMINANTLY MONONUCLEAR NO ORGANISMS SEEN    Culture   Final    NO GROWTH 3 DAYS Performed at [facility], [address], [place], [place] [address]    Report Status 09/13/2017 FINAL  Final    Radiology Reports Ct Abdomen Pelvis Wo Contrast  Result Date: 09/10/2017 CLINICAL DATA:  67 y/o M; weakness, abdominal pain, swelling, jaundice, nausea, and not eating. EXAM: CT ABDOMEN AND PELVIS WITHOUT CONTRAST TECHNIQUE: Multidetector CT imaging of the abdomen and pelvis was performed following the standard protocol without IV contrast. COMPARISON:  11/16/2007 CT of abdomen and pelvis FINDINGS: Lower chest: Small left  pleural effusion. Hepatobiliary: Severe intra and extrahepatic biliary ductal dilatation and distention of the gallbladder. Small gallstones. The common bile duct is dilated to the pancreatic head where it abruptly tapers (series 6, image 78). Common bile duct measures up to 3.3 cm in diameter. Multiple hypoattenuating pancreatic lesions, several with coarse calcifications, the largest in the right lobe of the liver peripherally spanning 6.8 x 6.0 cm (series 3, image 21). The large right lobe of liver lesion has a suggestion of capsular retraction. Pancreas: No significant dilatation of the pancreatic duct. No peripancreatic inflammatory change. Spleen: Normal in size without focal abnormality. Adrenals/Urinary Tract: Adrenal glands are unremarkable. Kidneys are normal, without renal calculi, focal lesion, or hydronephrosis. Bladder is unremarkable. Stomach/Bowel: Stomach is within normal limits. Appendix not identified. No evidence of bowel wall thickening, distention, or inflammatory changes. Vascular/Lymphatic: Aortic atherosclerosis. No enlarged abdominal or pelvic lymph nodes. Reproductive: Prostate is unremarkable. Other: Large volume  of peritoneal ascites. Musculoskeletal: No fracture is seen. IMPRESSION: 1. Severe intra and extrahepatic biliary ductal dilatation and distention of the gallbladder. Abrupt tapering of the common bile duct within pancreatic head may represent underlying mass, stricture, or radiolucent stone. 2. Large volume of peritoneal ascites. 3. Ill-defined hypoattenuating lesions within the liver with coarse calcifications measuring up to 6.8 cm in the right lobe of the liver. Findings of capsular retraction suggest cholangiocarcinoma. 4. When the patient is clinically stable and able to follow directions and hold their breath further evaluation with dedicated abdominal MRI should be considered. Electronically Signed   By: Kristine Garbe M.D.   On: 09/10/2017 03:00   Mr Abdomen Mrcp Wo Contrast  Result Date: 09/12/2017 CLINICAL DATA:  Biliary dilatation. EXAM: MRI ABDOMEN WITHOUT CONTRAST  (INCLUDING MRCP) TECHNIQUE: Multiplanar multisequence MR imaging of the abdomen was performed. Heavily T2-weighted images of the biliary and pancreatic ducts were obtained, and three-dimensional MRCP images were rendered by post processing. COMPARISON:  CT scan 09/09/2017 FINDINGS: Lower chest: Small left pleural effusion with dependent atelectasis. Hepatobiliary: Innumerable liver lesions are identified in both hepatic lobes. Most of these measure in the 10-15 mm size range although a large 5.6 x 8.9 cm lesions identified at the junction of segments VII and VIII. 3.5 x 3.7 cm lesion is identified in segment IV. Gallbladder is markedly distended. There is dramatic intra and extrahepatic biliary duct dilatation. Common bile duct is dilated up to 2.2 cm with relatively abrupt tapering in the head of the pancreas with about 10-15 mm of "string sign" proximal to the ampulla. This area is not well evaluated due to motion artifact. No obstructing mass lesion is evident although exam is limited by motion artifact and lack of  intravenous contrast material. Pancreas: Not well seen due to motion artifact. No substantial dilatation of the main pancreatic duct. Spleen:  Unremarkable. Adrenals/Urinary Tract: No adrenal nodule or mass. Small cysts are noted in the kidneys bilaterally. Stomach/Bowel: Stomach is nondilated. No bowel dilatation evident within the visualized abdomen. Vascular/Lymphatic: No abdominal aortic aneurysm. Flow void in the portal vein and superior mesenteric vein suggests patency. No bulky lymphadenopathy evident although assessment limited by lack of contrast material. Other:  Large volume ascites. Musculoskeletal: Body wall edema. No abnormal marrow signal evident. IMPRESSION: 1. Relatively dramatic dilatation of the intra and extrahepatic bile ducts with marked distention of the gallbladder. Common bile duct is 2.2 cm in the head of the pancreas in tapers down to what appears to be a 10-15 mm "string sign" tracking into the ampulla. This region  of the distal duct is not well evaluated due to lack of contrast and motion artifact. Imaging appearance does raise the question of a pancreatic head lesion causing obstruction. No evidence for cholelithiasis or choledocholithiasis. ERCP/EUS would likely prove helpful to further evaluate. 2. Innumerable liver lesions ranging in size from 10 mm up to 9 cm. Imaging features compatible with metastatic disease and 1 of the larger lesions may represent primary source. 3. Large volume ascites 4. Body wall edema Electronically Signed   By: Misty Stanley M.D.   On: 09/12/2017 13:36   Mr 3d Recon At Scanner  Result Date: 09/12/2017 CLINICAL DATA:  Biliary dilatation. EXAM: MRI ABDOMEN WITHOUT CONTRAST  (INCLUDING MRCP) TECHNIQUE: Multiplanar multisequence MR imaging of the abdomen was performed. Heavily T2-weighted images of the biliary and pancreatic ducts were obtained, and three-dimensional MRCP images were rendered by post processing. COMPARISON:  CT scan 09/09/2017 FINDINGS:  Lower chest: Small left pleural effusion with dependent atelectasis. Hepatobiliary: Innumerable liver lesions are identified in both hepatic lobes. Most of these measure in the 10-15 mm size range although a large 5.6 x 8.9 cm lesions identified at the junction of segments VII and VIII. 3.5 x 3.7 cm lesion is identified in segment IV. Gallbladder is markedly distended. There is dramatic intra and extrahepatic biliary duct dilatation. Common bile duct is dilated up to 2.2 cm with relatively abrupt tapering in the head of the pancreas with about 10-15 mm of "string sign" proximal to the ampulla. This area is not well evaluated due to motion artifact. No obstructing mass lesion is evident although exam is limited by motion artifact and lack of intravenous contrast material. Pancreas: Not well seen due to motion artifact. No substantial dilatation of the main pancreatic duct. Spleen:  Unremarkable. Adrenals/Urinary Tract: No adrenal nodule or mass. Small cysts are noted in the kidneys bilaterally. Stomach/Bowel: Stomach is nondilated. No bowel dilatation evident within the visualized abdomen. Vascular/Lymphatic: No abdominal aortic aneurysm. Flow void in the portal vein and superior mesenteric vein suggests patency. No bulky lymphadenopathy evident although assessment limited by lack of contrast material. Other:  Large volume ascites. Musculoskeletal: Body wall edema. No abnormal marrow signal evident. IMPRESSION: 1. Relatively dramatic dilatation of the intra and extrahepatic bile ducts with marked distention of the gallbladder. Common bile duct is 2.2 cm in the head of the pancreas in tapers down to what appears to be a 10-15 mm "string sign" tracking into the ampulla. This region of the distal duct is not well evaluated due to lack of contrast and motion artifact. Imaging appearance does raise the question of a pancreatic head lesion causing obstruction. No evidence for cholelithiasis or choledocholithiasis. ERCP/EUS  would likely prove helpful to further evaluate. 2. Innumerable liver lesions ranging in size from 10 mm up to 9 cm. Imaging features compatible with metastatic disease and 1 of the larger lesions may represent primary source. 3. Large volume ascites 4. Body wall edema Electronically Signed   By: Misty Stanley M.D.   On: 09/12/2017 13:36   Dg Chest Port 1 View  Result Date: 09/10/2017 CLINICAL DATA:  Abdominal pain and swelling for 2 days EXAM: PORTABLE CHEST 1 VIEW COMPARISON:  None. FINDINGS: The lungs are not optimally aerated, with some elevation of the right hemidiaphragm and mild volume loss at the left lung base. However no definite pneumonia is seen and no pleural effusion is noted. Mediastinal and hilar contours are unremarkable and the heart is within upper limits of normal. No bony abnormality  is seen. IMPRESSION: Poor inspiration with mild volume loss at the lung bases. No definite active process. Electronically Signed   By: Ivar Drape M.D.   On: 09/10/2017 08:09   Ir Paracentesis  Result Date: 09/10/2017 INDICATION: Patient with history of abdominal pain, jaundice, elevated liver function tests/creatinine, intra/extrahepatic biliary ductal dilatation, liver lesions, ascites. Request made for diagnostic and therapeutic paracentesis. EXAM: ULTRASOUND GUIDED DIAGNOSTIC AND THERAPEUTIC PARACENTESIS MEDICATIONS: None COMPLICATIONS: None immediate. PROCEDURE: Informed written consent was obtained from the patient after a discussion of the risks, benefits and alternatives to treatment. A timeout was performed prior to the initiation of the procedure. Initial ultrasound scanning demonstrates a moderate amount of ascites within the left lower abdominal quadrant. The left lower abdomen was prepped and draped in the usual sterile fashion. 2% lidocaine was used for local anesthesia. Following this, a 6 Fr Safe-T-Centesis catheter was introduced. An ultrasound image was saved for documentation purposes. The  paracentesis was performed. The catheter was removed and a dressing was applied. The patient tolerated the procedure well without immediate post procedural complication. FINDINGS: A total of approximately 2.5 liters of golden yellow fluid was removed. Samples were sent to the laboratory as requested by the clinical team. IMPRESSION: Successful ultrasound-guided diagnostic and therapeutic paracentesis yielding 2.5 liters of peritoneal fluid. Read by: Rowe Robert, PA-C Electronically Signed   By: Jerilynn Mages.  Shick M.D.   On: 09/10/2017 12:05    Lab Data:  CBC: Recent Labs  Lab 09/09/17 2110 09/10/17 0418 09/13/17 0643 09/15/17 0429  WBC 15.9* 11.3* 7.2 8.4  NEUTROABS  --  9.0*  --   --   HGB 11.9* 10.5* 8.8* 8.7*  HCT 33.9* 30.4* 25.8* 25.5*  MCV 94.7 95.0 95.6 98.1  PLT 136* 114* 66* 77*   Basic Metabolic Panel: Recent Labs  Lab 09/10/17 0418 09/11/17 0448 09/12/17 0358 09/13/17 0643 09/15/17 0429  NA 136 137 137 140 138  K 3.6 3.3* 2.6* 3.7 3.3*  CL 106 108 107 108 104  CO2 17* 16* 21* 22 22  GLUCOSE 68 71 92 90 103*  BUN 51* 55* 38* 26* 17  CREATININE 2.45* 2.46* 1.75* 1.39* 1.26*  CALCIUM 7.8* 7.8* 8.0* 8.2* 7.9*  MG  --   --  1.9  --   --    GFR: Estimated Creatinine Clearance: 52 mL/min (A) (by C-G formula based on SCr of 1.26 mg/dL (H)). Liver Function Tests: Recent Labs  Lab 09/09/17 2233 09/10/17 0418 09/11/17 0448 09/12/17 0358 09/13/17 0643  AST 152* 143* 127* 96* 96*  ALT 55 49 49 42 43  ALKPHOS 517* 483* 454* 330* 385*  BILITOT 18.1* 16.7* 18.5* 20.5* 24.1*  PROT 7.1 6.4* 5.8* 5.6* 5.8*  ALBUMIN 1.2* 1.1* 1.4* 2.2* 1.9*   Recent Labs  Lab 09/09/17 2233  LIPASE 810*   No results for input(s): AMMONIA in the last 168 hours. Coagulation Profile: Recent Labs  Lab 09/10/17 0313 09/11/17 0448 09/13/17 0643  INR 2.45 1.59 1.47   Cardiac Enzymes: Recent Labs  Lab 09/10/17 0847 09/11/17 0448  TROPONINI 0.30* 0.08*   BNP (last 3 results) No  results for input(s): PROBNP in the last 8760 hours. HbA1C: No results for input(s): HGBA1C in the last 72 hours. CBG: Recent Labs  Lab 09/14/17 2018 09/15/17 0138 09/15/17 0204 09/15/17 0457 09/15/17 0748  GLUCAP 90 66 76 108* 94   Lipid Profile: No results for input(s): CHOL, HDL, LDLCALC, TRIG, CHOLHDL, LDLDIRECT in the last 72 hours. Thyroid Function Tests: No  results for input(s): TSH, T4TOTAL, FREET4, T3FREE, THYROIDAB in the last 72 hours. Anemia Panel: No results for input(s): VITAMINB12, FOLATE, FERRITIN, TIBC, IRON, RETICCTPCT in the last 72 hours. Urine analysis:    Component Value Date/Time   COLORURINE AMBER (A) 09/09/2017 0306   APPEARANCEUR HAZY (A) 09/09/2017 0306   LABSPEC 1.020 09/09/2017 0306   PHURINE 5.0 09/09/2017 0306   GLUCOSEU NEGATIVE 09/09/2017 0306   HGBUR NEGATIVE 09/09/2017 0306   BILIRUBINUR MODERATE (A) 09/09/2017 0306   KETONESUR NEGATIVE 09/09/2017 0306   PROTEINUR 30 (A) 09/09/2017 0306   UROBILINOGEN 1.0 11/08/2007 0028   NITRITE NEGATIVE 09/09/2017 0306   LEUKOCYTESUR NEGATIVE 09/09/2017 0306     Osceola Holian M.D. [facility] 09/15/2017, 11:15 AM  Pager: [phone] Between 7am to 7pm - call Pager - [phone]  After 7pm go to [email or web address] - password [email or web address]  Call night coverage person covering after 7pm

## 2017-09-15 NOTE — Procedures (Signed)
   US guided LLQ paracentesis  5.1 L bright yellow fluid  Sent for labs per MD  Tolerated well

## 2017-09-16 DIAGNOSIS — R188 Other ascites: Secondary | ICD-10-CM

## 2017-09-16 DIAGNOSIS — C221 Intrahepatic bile duct carcinoma: Secondary | ICD-10-CM

## 2017-09-16 LAB — GLUCOSE, CAPILLARY
GLUCOSE-CAPILLARY: 84 mg/dL (ref 65–99)
GLUCOSE-CAPILLARY: 93 mg/dL (ref 65–99)
Glucose-Capillary: 77 mg/dL (ref 65–99)
Glucose-Capillary: 111 mg/dL — ABNORMAL HIGH (ref 65–99)

## 2017-09-16 MED ORDER — MORPHINE SULFATE (CONCENTRATE) 10 MG/0.5ML PO SOLN: 5.0000 mg | ORAL | Status: DC | PRN

## 2017-09-16 MED ORDER — ONDANSETRON 4 MG PO TBDP: 4.0000 mg | ORAL_TABLET | Freq: Three times a day (TID) | ORAL | 0 refills | Status: AC | PRN

## 2017-09-16 MED ORDER — HYDROCODONE-ACETAMINOPHEN 5-325 MG PO TABS: 1.0000 | ORAL_TABLET | Freq: Four times a day (QID) | ORAL | 0 refills | Status: AC | PRN

## 2017-09-16 MED ORDER — MORPHINE SULFATE (CONCENTRATE) 10 MG/0.5ML PO SOLN: 5.0000 mg | ORAL | 0 refills | Status: AC | PRN

## 2017-09-16 NOTE — Care Management Note (Signed)
Case Management Note  Patient Details  Name: Randy Nielsen MRN: [id number] Date of Birth: 10/29/1950  Subjective/Objective:                    Action/Plan: Pt discharging home today with [facility] following at home. CM spoke to patient and his spouse and the walker ordered will be delivered today. Patients wife is planning on transporting him home in the car. CM asked bedside RN to make sure DNR form sent home with patient. CM faxed d/c summary to [facility] [phone].  Expected Discharge Date:  09/16/17               Expected Discharge Plan:  Home w Hospice Care  In-House Referral:     Discharge planning Services  CM Consult  Post Acute Care Choice:  Hospice Choice offered to:  Spouse  DME Arranged:    DME Agency:     HH Arranged:    [place] Agency:  [facility]  Status of Service:  Completed, signed off  If discussed at [place] of Stay Meetings, dates discussed:    Additional Comments:  Pollie Friar, RN 09/16/2017, 11:54 AM

## 2017-09-16 NOTE — Progress Notes (Addendum)
Palliative:  I met today with Mr. Balding and significant other, [name], at bedside. Randy Nielsen is lethargic and not very interactive but will answer my questions that I direct to him. They are both very clear for their desire to return home and focus on comfort with hospice. Mr. Hedberg says "everyone has to die sometime." They do ask about his paracentesis and they are interested in future paracentesis as he has felt so much better after having 5L removed yesterday. I did address the possibility of peritoneal drain solely for comfort that can be drained at home. Mr. Fosberg would be willing for this to be placed if possible. Will speak with hospice about possibility of scheduling peritoneal drain placement. Unfortunately drain cannot be placed today as fluid has recently been removed and they do desire to go home with hospice today.   I also called and spoke with son/HCPOA, Terrance. Celesta Gentile had a few questions regarding results from CT and stent placement and I answered his questions to the best of my ability. He agrees with not pursuing repeat ERCP or biopsy at this time and focus on comfort. 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 His main concern is with the fluid re-accumulating in his abdomen and that he will "drown to death" and be very uncomfortable at home. I reassured him that when he returned my call I was already on the phone with hospice requesting if peritoneal drain could be arranged after home with hospice. I also explained that if for some reason drain cannot be placed we have many other options to ensure his father does not suffer with pain or shortness of breath. Terrance agrees and understands and again clarifies that this is his main goal for his father to be comfortable.   29 min  Vinie Sill, NP Palliative Medicine Team Pager # [phone] (M-F 8a-5p) Team Phone # [phone] (Nights/Weekends)

## 2017-09-16 NOTE — Progress Notes (Signed)
[facility]) RN Visit @ 0830  Continuing to follow in anticipation of admission to [facility] services at home after discharge.  Met with patient and [name] in the room. Per [name] plan is for discharge possibly late in the day. [name] states DME (walker) is to be delivered today.   Please send signed and completed DNR home with patient at discharge. Please send scripts for any medications patient does not already have including medications for symptom management.  [facility] referral center is aware of this patient and will arrange RN visit at home after discharge.  Please call with any hospice related questions or concerns.  Thank you, Margaretmary Eddy, RN, [facility] Liaison [phone]  [place] are on AMION.

## 2017-09-16 NOTE — Discharge Summary (Signed)
Physician Discharge Summary   Patient ID: Randy Nielsen MRN: [id number] DOB/AGE: 11-Dec-1950 67 y.o.  Admit date: 09/09/2017 Discharge date: 09/16/2017  Primary Care Physician:  [name], MD   Recommendations for Outpatient Follow-up:  1. 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 Abdominal pain . Obstructive jaundice . Ascites status post paracentesis x2 . ARF (acute renal failure) ([facility]), possible hepatorenal syndrome Diabetes mellitus 2 Anemia, thrombocytopenia Severe protein calorie malnutrition  Consults:   Gastroenterology Palliative medicine Interventional radiology  Allergies:  No Known Allergies   DISCHARGE MEDICATIONS: Allergies as of 09/16/2017   No Known Allergies     Medication List    STOP taking these medications   naproxen sodium 220 MG tablet Commonly known as:  ALEVE     TAKE these medications   HYDROcodone-acetaminophen 5-325 MG tablet Commonly known as:  NORCO/VICODIN Take 1-2 tablets by mouth every 6 (six) hours as needed for moderate pain or severe pain.   morphine CONCENTRATE 10 MG/0.5ML Soln concentrated solution Take 0.25 mLs (5 mg total) by mouth every 4 (four) hours as needed for severe pain (SOB). Pain, shortness of breath   ondansetron 4 MG disintegrating tablet Commonly known as:  ZOFRAN ODT Take 1 tablet (4 mg total) by mouth every 8 (eight) hours as needed for nausea or vomiting.        Brief H and P: For complete details please refer to admission H and P, but in brief 67 y.o.malewithhistory of diabetes mellitus, hypertension previous pancreatitis and pancreatic mass has not been to a doctor for many years admitted on 09/10/17 with obstructive  jaundice after presenting with abdominal pain and distention . 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 Alessandra Bevels. Palliative care was also consulted.  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  Innumerable liver lesions compatible with metastatic disease. -Elevated [id number], normal AFP, elevated bilirubin 24.1, trending up, transaminitis with obstructive pattern -GI was consulted, attempted ERCP however was aborted because of difficult common bile duct cannulation, PD was cannulated and subsequently pancreatic stent was placed - Palliative care consulted, goals of care were discussed with the patient and family, overall poor prognosis, patient opted for no further interventions and hospice. -Discussed with IR, given palliative approach, did not recommend liver biopsy and very poor functional status and prognosis.   -Patient had ultrasound-guided paracentesis on 5/7 and subsequently on 5/12.  He may benefit from peritoneal drain outpatient.  Hospice will arrange that.      Ascites -Status post ultrasound-guided paracentesis on 5/7, 2.5 L ascitic fluid removed, studies negative for SBP -Zosyn was discontinued. Unable to give diuretics due to hypotension, BP in 70's-80's.  Albumin given on 5/11 -Therapeutic, palliative paracentesis was done on 5/12 removing 5.1 L..  Patient will benefit from outpatient peritoneal drain.  Hypotension -Likely due to #1,  hypoalbuminemia, hepatorenal syndrome    ARF (acute renal failure) ([facility]) -Likely  due to #1, possible hepatorenal syndrome, continue to avoid nephrotoxic agents,  -Creatinine improved to 1.2, plateaued at 2.4    Diabetes mellitus type 2 in nonobese (HCC) Hemoglobin A1c 5.2,  Anemia, thrombocytopenia -Likely due to #1, transfuse as indicated for hemoglobin less than 8  Goals of care -Comfort care, palliative care closely followed the patient.  Hospice at home will be arranged by case management.  Severe protein calorie malnutrition Continue nutritional supplements  Day of Discharge S: Belly soft, feels better after paracentesis.  Hoping to go home today  BP (!) 71/45 (BP Location: Left Arm)   Pulse 80   Temp 98.2 F (36.8 C)   Resp 18   Ht 5\' 6"  (1.676 m)   Wt 66.9 kg (147 lb 7.8 oz)   SpO2 100%   BMI 23.81 kg/m   Physical Exam: General: Alert and awake oriented x3 not in any acute distress.  Frail and ill-appearing HEENT: anicteric sclera, pupils reactive to light and accommodation CVS: S1-S2 clear no murmur rubs or gallops Chest: clear to auscultation bilaterally, no wheezing rales or rhonchi Abdomen: soft nontender, distended, normal bowel sounds Extremities: no cyanosis, clubbing or edema noted bilaterally    The results of significant diagnostics from this hospitalization (including imaging, microbiology, ancillary and laboratory) are listed below for reference.      Procedures/Studies:  Ct Abdomen Pelvis Wo Contrast  Result Date: 09/10/2017 CLINICAL DATA:  67 y/o M; weakness, abdominal pain, swelling, jaundice, nausea, and not eating. EXAM: CT ABDOMEN AND PELVIS WITHOUT CONTRAST TECHNIQUE: Multidetector CT imaging of the abdomen and pelvis was performed following the standard protocol without IV contrast. COMPARISON:  11/16/2007 CT of abdomen and pelvis FINDINGS: Lower chest: Small left pleural effusion. Hepatobiliary: Severe intra and extrahepatic biliary  ductal dilatation and distention of the gallbladder. Small gallstones. The common bile duct is dilated to the pancreatic head where it abruptly tapers (series 6, image 78). Common bile duct measures up to 3.3 cm in diameter. Multiple hypoattenuating pancreatic lesions, several with coarse calcifications, the largest in the right lobe of the liver peripherally spanning 6.8 x 6.0 cm (series 3, image 21). The large right lobe of liver lesion has a suggestion of capsular retraction. Pancreas: No significant dilatation of the pancreatic duct. No peripancreatic inflammatory change. Spleen: Normal in size without focal abnormality. Adrenals/Urinary Tract: Adrenal glands are unremarkable. Kidneys are normal, without renal calculi, focal lesion, or hydronephrosis. Bladder is unremarkable. Stomach/Bowel: Stomach is within normal limits. Appendix not identified. No evidence of bowel wall thickening, distention, or inflammatory changes. Vascular/Lymphatic: Aortic atherosclerosis. No enlarged abdominal or pelvic lymph nodes. Reproductive: Prostate is unremarkable. Other: Large volume of peritoneal ascites. Musculoskeletal: No fracture is seen. IMPRESSION: 1. Severe intra and extrahepatic biliary ductal dilatation and distention of the gallbladder. Abrupt tapering of the common bile duct within pancreatic head may represent underlying mass, stricture, or radiolucent stone. 2. Large volume of peritoneal ascites. 3. Ill-defined hypoattenuating lesions within the liver with coarse calcifications measuring up to 6.8 cm in the right lobe of the liver. Findings of capsular retraction suggest cholangiocarcinoma. 4. When the patient is clinically stable and able to follow directions and hold their breath further evaluation with dedicated abdominal MRI should be considered. Electronically Signed   By: Kristine Garbe M.D.   On: 09/10/2017 03:00   Mr Abdomen Mrcp Wo Contrast  Result Date: 09/12/2017 CLINICAL DATA:  Biliary  dilatation. EXAM: MRI ABDOMEN WITHOUT CONTRAST  (INCLUDING MRCP) TECHNIQUE: Multiplanar multisequence MR imaging of the abdomen  was performed. Heavily T2-weighted images of the biliary and pancreatic ducts were obtained, and three-dimensional MRCP images were rendered by post processing. COMPARISON:  CT scan 09/09/2017 FINDINGS: Lower chest: Small left pleural effusion with dependent atelectasis. Hepatobiliary: Innumerable liver lesions are identified in both hepatic lobes. Most of these measure in the 10-15 mm size range although a large 5.6 x 8.9 cm lesions identified at the junction of segments VII and VIII. 3.5 x 3.7 cm lesion is identified in segment IV. Gallbladder is markedly distended. There is dramatic intra and extrahepatic biliary duct dilatation. Common bile duct is dilated up to 2.2 cm with relatively abrupt tapering in the head of the pancreas with about 10-15 mm of "string sign" proximal to the ampulla. This area is not well evaluated due to motion artifact. No obstructing mass lesion is evident although exam is limited by motion artifact and lack of intravenous contrast material. Pancreas: Not well seen due to motion artifact. No substantial dilatation of the main pancreatic duct. Spleen:  Unremarkable. Adrenals/Urinary Tract: No adrenal nodule or mass. Small cysts are noted in the kidneys bilaterally. Stomach/Bowel: Stomach is nondilated. No bowel dilatation evident within the visualized abdomen. Vascular/Lymphatic: No abdominal aortic aneurysm. Flow void in the portal vein and superior mesenteric vein suggests patency. No bulky lymphadenopathy evident although assessment limited by lack of contrast material. Other:  Large volume ascites. Musculoskeletal: Body wall edema. No abnormal marrow signal evident. IMPRESSION: 1. Relatively dramatic dilatation of the intra and extrahepatic bile ducts with marked distention of the gallbladder. Common bile duct is 2.2 cm in the head of the pancreas in tapers  down to what appears to be a 10-15 mm "string sign" tracking into the ampulla. This region of the distal duct is not well evaluated due to lack of contrast and motion artifact. Imaging appearance does raise the question of a pancreatic head lesion causing obstruction. No evidence for cholelithiasis or choledocholithiasis. ERCP/EUS would likely prove helpful to further evaluate. 2. Innumerable liver lesions ranging in size from 10 mm up to 9 cm. Imaging features compatible with metastatic disease and 1 of the larger lesions may represent primary source. 3. Large volume ascites 4. Body wall edema Electronically Signed   By: Misty Stanley M.D.   On: 09/12/2017 13:36   Mr 3d Recon At Scanner  Result Date: 09/12/2017 CLINICAL DATA:  Biliary dilatation. EXAM: MRI ABDOMEN WITHOUT CONTRAST  (INCLUDING MRCP) TECHNIQUE: Multiplanar multisequence MR imaging of the abdomen was performed. Heavily T2-weighted images of the biliary and pancreatic ducts were obtained, and three-dimensional MRCP images were rendered by post processing. COMPARISON:  CT scan 09/09/2017 FINDINGS: Lower chest: Small left pleural effusion with dependent atelectasis. Hepatobiliary: Innumerable liver lesions are identified in both hepatic lobes. Most of these measure in the 10-15 mm size range although a large 5.6 x 8.9 cm lesions identified at the junction of segments VII and VIII. 3.5 x 3.7 cm lesion is identified in segment IV. Gallbladder is markedly distended. There is dramatic intra and extrahepatic biliary duct dilatation. Common bile duct is dilated up to 2.2 cm with relatively abrupt tapering in the head of the pancreas with about 10-15 mm of "string sign" proximal to the ampulla. This area is not well evaluated due to motion artifact. No obstructing mass lesion is evident although exam is limited by motion artifact and lack of intravenous contrast material. Pancreas: Not well seen due to motion artifact. No substantial dilatation of the main  pancreatic duct. Spleen:  Unremarkable. Adrenals/Urinary Tract:  No adrenal nodule or mass. Small cysts are noted in the kidneys bilaterally. Stomach/Bowel: Stomach is nondilated. No bowel dilatation evident within the visualized abdomen. Vascular/Lymphatic: No abdominal aortic aneurysm. Flow void in the portal vein and superior mesenteric vein suggests patency. No bulky lymphadenopathy evident although assessment limited by lack of contrast material. Other:  Large volume ascites. Musculoskeletal: Body wall edema. No abnormal marrow signal evident. IMPRESSION: 1. Relatively dramatic dilatation of the intra and extrahepatic bile ducts with marked distention of the gallbladder. Common bile duct is 2.2 cm in the head of the pancreas in tapers down to what appears to be a 10-15 mm "string sign" tracking into the ampulla. This region of the distal duct is not well evaluated due to lack of contrast and motion artifact. Imaging appearance does raise the question of a pancreatic head lesion causing obstruction. No evidence for cholelithiasis or choledocholithiasis. ERCP/EUS would likely prove helpful to further evaluate. 2. Innumerable liver lesions ranging in size from 10 mm up to 9 cm. Imaging features compatible with metastatic disease and 1 of the larger lesions may represent primary source. 3. Large volume ascites 4. Body wall edema Electronically Signed   By: Misty Stanley M.D.   On: 09/12/2017 13:36   US Paracentesis  Result Date: 09/15/2017 INDICATION: Ascites EXAM: ULTRASOUND-GUIDED PARACENTESIS COMPARISON:  None. MEDICATIONS: 10 cc 1% lidocaine. COMPLICATIONS: None immediate. TECHNIQUE: Informed written consent was obtained from the patient after a discussion of the risks, benefits and alternatives to treatment. A timeout was performed prior to the initiation of the procedure. Initial ultrasound scanning demonstrates a large amount of ascites within the left lower abdominal quadrant. The left lower abdomen was  prepped and draped in the usual sterile fashion. 1% lidocaine with epinephrine was used for local anesthesia. Under direct ultrasound guidance, a 19 gauge, 7-cm, Yueh catheter was introduced. An ultrasound image was saved for documentation purposed. The paracentesis was performed. The catheter was removed and a dressing was applied. The patient tolerated the procedure well without immediate post procedural complication. FINDINGS: A total of approximately 5.1 liters of bright yellow fluid was removed. Samples were sent to the laboratory as requested by the clinical team. IMPRESSION: Successful ultrasound-guided paracentesis yielding 5.1 liters of peritoneal fluid. Read by Lavonia Drafts [facility] Electronically Signed   By: Corrie Mckusick D.O.   On: 09/15/2017 11:07   Dg Chest Port 1 View  Result Date: 09/10/2017 CLINICAL DATA:  Abdominal pain and swelling for 2 days EXAM: PORTABLE CHEST 1 VIEW COMPARISON:  None. FINDINGS: The lungs are not optimally aerated, with some elevation of the right hemidiaphragm and mild volume loss at the left lung base. However no definite pneumonia is seen and no pleural effusion is noted. Mediastinal and hilar contours are unremarkable and the heart is within upper limits of normal. No bony abnormality is seen. IMPRESSION: Poor inspiration with mild volume loss at the lung bases. No definite active process. Electronically Signed   By: Ivar Drape M.D.   On: 09/10/2017 08:09   Ir Paracentesis  Result Date: 09/10/2017 INDICATION: Patient with history of abdominal pain, jaundice, elevated liver function tests/creatinine, intra/extrahepatic biliary ductal dilatation, liver lesions, ascites. Request made for diagnostic and therapeutic paracentesis. EXAM: ULTRASOUND GUIDED DIAGNOSTIC AND THERAPEUTIC PARACENTESIS MEDICATIONS: None COMPLICATIONS: None immediate. PROCEDURE: Informed written consent was obtained from the patient after a discussion of the risks, benefits and alternatives to  treatment. A timeout was performed prior to the initiation of the procedure. Initial ultrasound scanning demonstrates a  moderate amount of ascites within the left lower abdominal quadrant. The left lower abdomen was prepped and draped in the usual sterile fashion. 2% lidocaine was used for local anesthesia. Following this, a 6 Fr Safe-T-Centesis catheter was introduced. An ultrasound image was saved for documentation purposes. The paracentesis was performed. The catheter was removed and a dressing was applied. The patient tolerated the procedure well without immediate post procedural complication. FINDINGS: A total of approximately 2.5 liters of golden yellow fluid was removed. Samples were sent to the laboratory as requested by the clinical team. IMPRESSION: Successful ultrasound-guided diagnostic and therapeutic paracentesis yielding 2.5 liters of peritoneal fluid. Read by: Rowe Robert, PA-C Electronically Signed   By: Jerilynn Mages.  Shick M.D.   On: 09/10/2017 12:05       LAB RESULTS: Basic Metabolic Panel: Recent Labs  Lab 09/12/17 0358 09/13/17 0643 09/15/17 0429  NA 137 140 138  K 2.6* 3.7 3.3*  CL 107 108 104  CO2 21* 22 22  GLUCOSE 92 90 103*  BUN 38* 26* 17  CREATININE 1.75* 1.39* 1.26*  CALCIUM 8.0* 8.2* 7.9*  MG 1.9  --   --    Liver Function Tests: Recent Labs  Lab 09/12/17 0358 09/13/17 0643  AST 96* 96*  ALT 42 43  ALKPHOS 330* 385*  BILITOT 20.5* 24.1*  PROT 5.6* 5.8*  ALBUMIN 2.2* 1.9*   Recent Labs  Lab 09/09/17 2233  LIPASE 810*   No results for input(s): AMMONIA in the last 168 hours. CBC: Recent Labs  Lab 09/10/17 0418 09/13/17 0643 09/15/17 0429  WBC 11.3* 7.2 8.4  NEUTROABS 9.0*  --   --   HGB 10.5* 8.8* 8.7*  HCT 30.4* 25.8* 25.5*  MCV 95.0 95.6 98.1  PLT 114* 66* 77*   Cardiac Enzymes: Recent Labs  Lab 09/10/17 0847 09/11/17 0448  TROPONINI 0.30* 0.08*   BNP: Invalid input(s): POCBNP CBG: Recent Labs  Lab 09/16/17 0349 09/16/17 0754   GLUCAP 77 111*      Disposition and Follow-up: Discharge Instructions    Diet general   Complete by:  As directed    Increase activity slowly   Complete by:  As directed        DISPOSITION: Home with hospice   DISCHARGE FOLLOW-UP Follow-up Information    [facility] [place] Follow up.   Contact information: [place]       [name], MD. Schedule an appointment as soon as possible for a visit.   Specialty:  Internal Medicine Why:  as needed  Contact information: [place] [address] [phone]            Time coordinating discharge:  40 minutes  Signed:   Estill Cotta M.D. [facility] 09/16/2017, 10:50 AM Pager: [phone]

## 2017-09-16 NOTE — Progress Notes (Signed)
[facility] to be D/C'd Home with Hospice per MD order.  Discussed with the patient and all questions fully answered.  VSS, Skin clean, dry and intact without evidence of skin break down, no evidence of skin tears noted. IV catheter discontinued intact. Site without signs and symptoms of complications. Dressing and pressure applied.  An After Visit Summary was printed and given to the patient. Patient received prescription.  D/c education completed with patient/family including follow up instructions, medication list, d/c activities limitations if indicated, with other d/c instructions as indicated by MD - patient able to verbalize understanding, all questions fully answered.   Patient instructed to return to ED, call 911, or call MD for any changes in condition.   Patient escorted via [place], and D/C home via private auto.  Richardean Chimera 09/16/2017 11:42 AM

## 2017-09-17 NOTE — Care Management Important Message (Signed)
Important Message  Patient Details  Name: Randy Nielsen MRN: [id number] Date of Birth: 1951-01-20   Medicare Important Message Given:  Yes  Signed on 09/16/2017  Orbie Pyo 09/17/2017, 7:13 AM

## 2017-09-20 LAB — CULTURE, BODY FLUID W GRAM STAIN -BOTTLE: Culture: NO GROWTH

## 2017-09-26 ENCOUNTER — Other Ambulatory Visit (HOSPITAL_COMMUNITY): Payer: Self-pay | Admitting: Internal Medicine

## 2017-09-26 DIAGNOSIS — R188 Other ascites: Secondary | ICD-10-CM

## 2017-09-27 ENCOUNTER — Ambulatory Visit (HOSPITAL_COMMUNITY)
Admission: RE | Admit: 2017-09-27 | Discharge: 2017-09-27 | Disposition: A | Discharge status: Home / Self Care | Source: Ambulatory Visit | Attending: Internal Medicine | Admitting: Internal Medicine

## 2017-09-27 ENCOUNTER — Encounter (HOSPITAL_COMMUNITY): Payer: Self-pay

## 2017-09-27 DIAGNOSIS — R188 Other ascites: Secondary | ICD-10-CM | POA: Diagnosis not present

## 2017-09-27 DIAGNOSIS — C259 Malignant neoplasm of pancreas, unspecified: Secondary | ICD-10-CM | POA: Diagnosis not present

## 2017-09-27 HISTORY — PX: IR PARACENTESIS: IMG2679

## 2017-09-27 MED ORDER — LIDOCAINE HCL (PF) 2 % IJ SOLN
INTRAMUSCULAR | Status: AC
  Filled 2017-09-27: qty 20

## 2017-09-27 MED ORDER — LIDOCAINE HCL 2 % IJ SOLN
INTRAMUSCULAR | Status: DC | PRN
  Administered 2017-09-27: 10 mL

## 2017-09-27 NOTE — Procedures (Signed)
PROCEDURE SUMMARY:  Successful US guided paracentesis from LLQ.  Yielded 2.4 L of clear, golden fluid.  No immediate complications.  Pt BP did drop to 60s/40s, procedure terminated at this time.Marland Kitchen   Specimen was not sent for labs.  Ascencion Dike PA-C 09/27/2017 1:56 PM

## 2017-10-05 DEATH — deceased

## 2020-04-14 IMAGING — MR MR 3D RECON AT SCANNER
11 series · 16 of 16 positions shown · non-contrast
Comparison: CT scan 09/09/2017

CLINICAL DATA: Biliary dilatation.

EXAM:
MRI ABDOMEN WITHOUT CONTRAST  (INCLUDING MRCP)
TECHNIQUE: Multiplanar multisequence MR imaging of the abdomen was performed.
Heavily T2-weighted images of the biliary and pancreatic ducts were
obtained, and three-dimensional MRCP images were rendered by post
processing.

[Series 4: T2 fat-sat · axial · 5.0mm · 0.74mm/px · 1 of 55 slices shown]
[im 1/55]
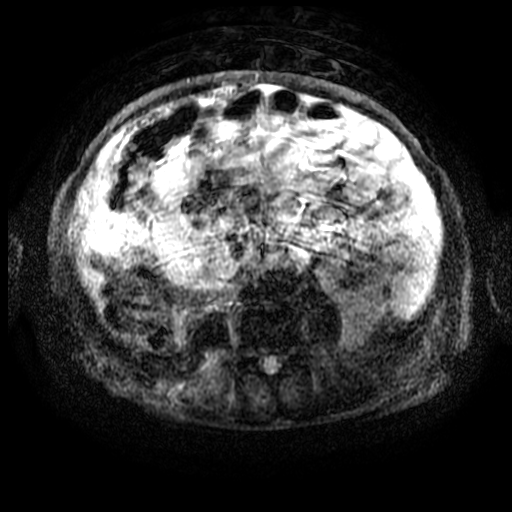

[Series 5: MRCP · coronal · 1.6mm · 0.62mm/px · 1 of 100 slices shown (1 of 3)]
[im 1/100]
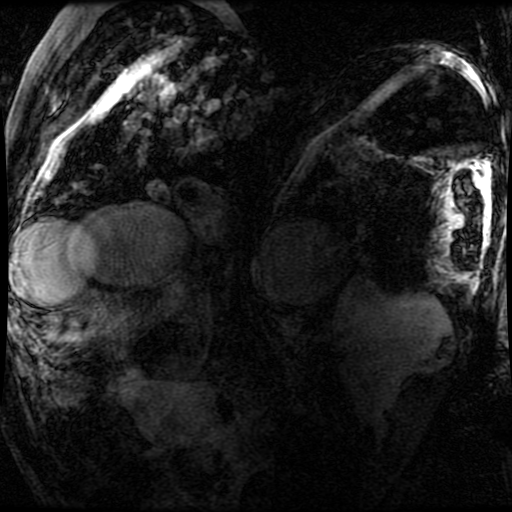

[Series 6: T2 · axial · 5.0mm · 0.74mm/px · 1 of 55 slices shown (1 of 2)]
[im 1/55]
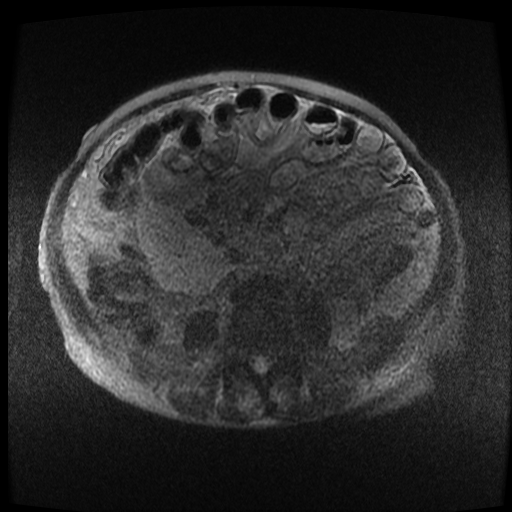

[Series 7: MRCP · coronal · 2.0mm · 0.70mm/px · 1 of 40 slices shown (2 of 3)]
[im 1/40]
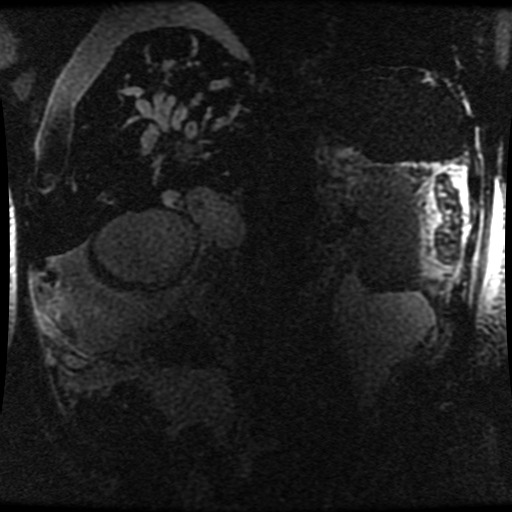

[Series 8: DWI b500 · axial · 6.0mm · 1.48mm/px · z∈[-93,+180]mm · 2 of 72 slices shown]
[im 1/72]
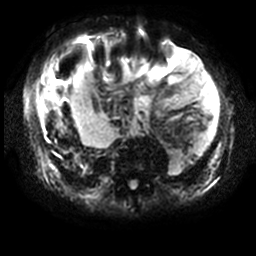
[im 72/72]
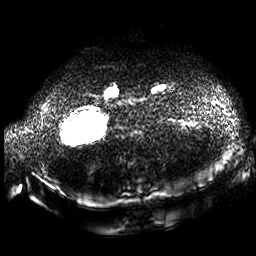

[Series 9: T2 · coronal · 5.0mm · 0.74mm/px · 1 of 54 slices shown (2 of 2)]
[im 1/54]
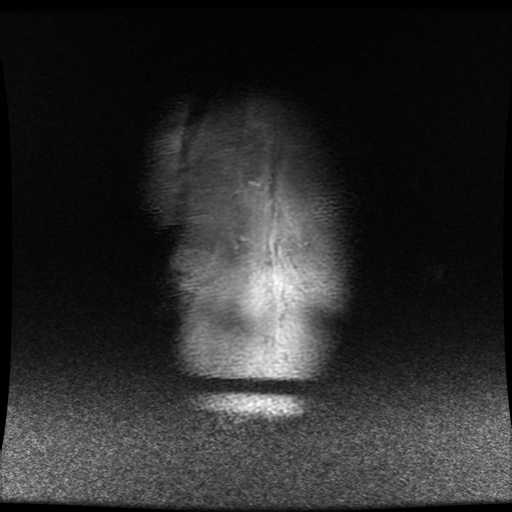

[Series 12: MRCP · coronal · 50.0mm · 0.70mm/px · 1 of 5 slices shown (3 of 3)]
[im 1/5]
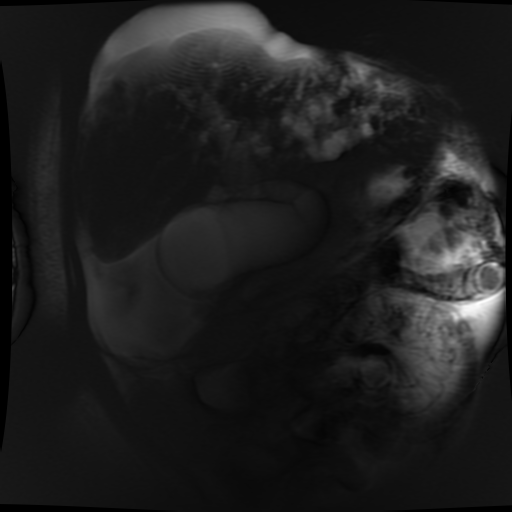

[Series 13: ax dualecho · axial · 5.0mm · 0.78mm/px · z∈[-86,+194]mm · 3 of 114 slices shown]
[im 1/114]
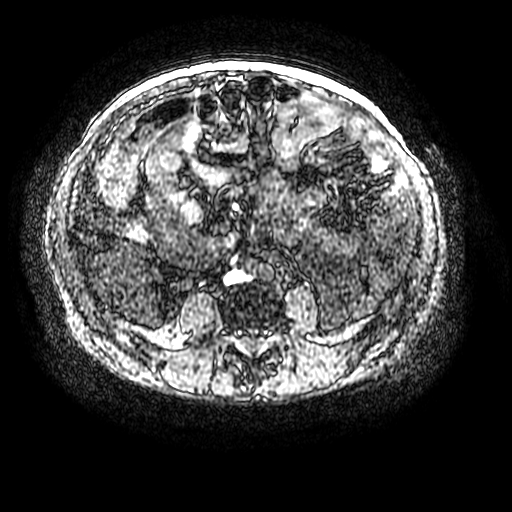
[im 57/114]
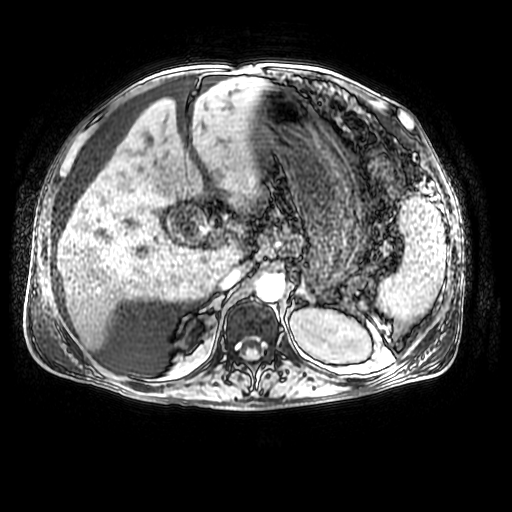
[im 114/114]
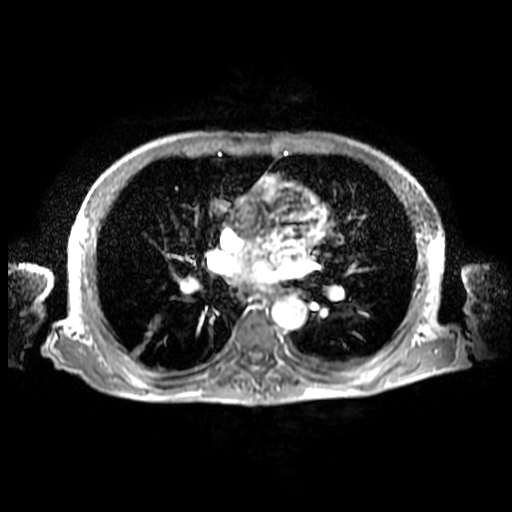

[Series 14: T1 dynamic · axial · 5.0mm · 0.78mm/px · z∈[-72,+185]mm · 3 of 104 slices shown]
[im 1/104]
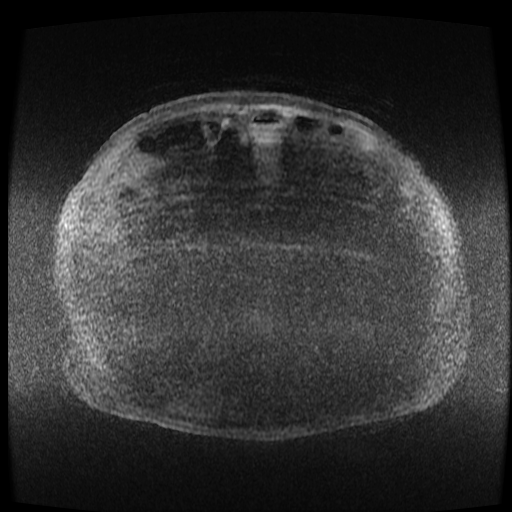
[im 52/104]
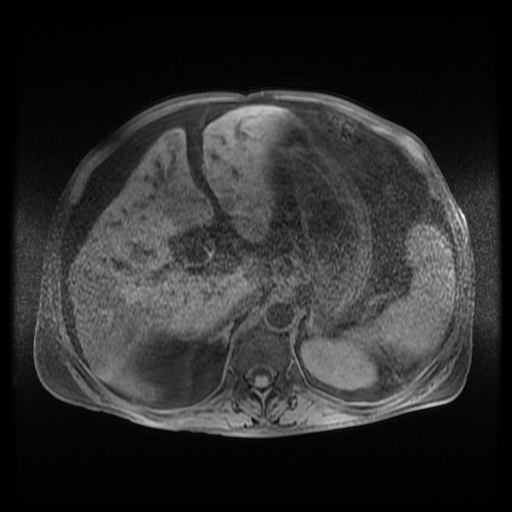
[im 104/104]
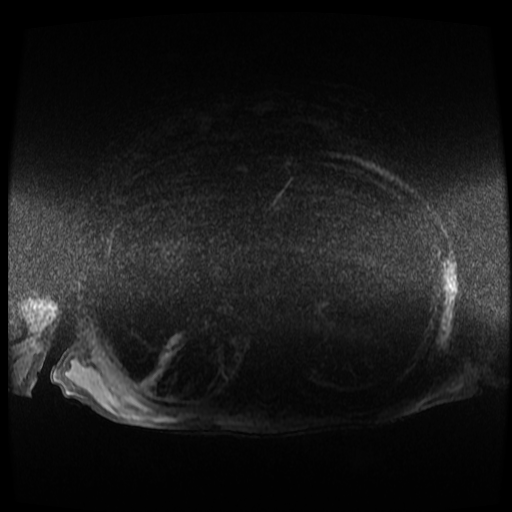

[Series 501: processed images · sagittal · 1.6mm · 0.62mm/px · 1 of 20 slices shown]
[im 1/20]
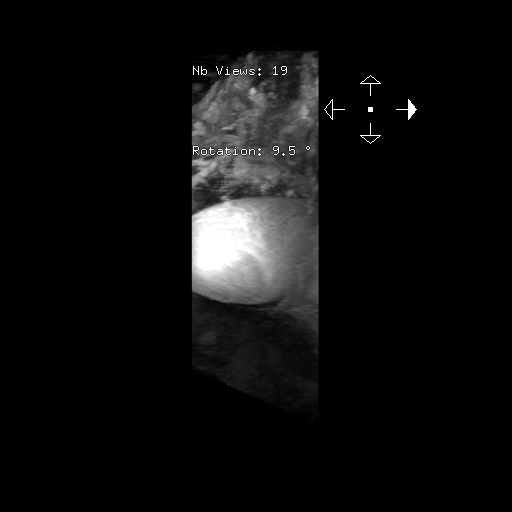

[Series 801: DWI · axial · 6.0mm · 1.48mm/px · 1 of 36 slices shown]
[im 1/36]
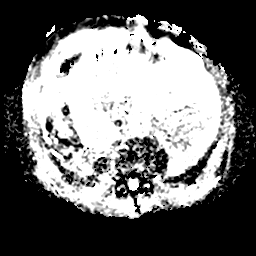

[16 of 16 positions shown; findings below may reference images not displayed]

FINDINGS: Lower chest: Small left pleural effusion with dependent atelectasis.

Hepatobiliary: Innumerable liver lesions are identified in both
hepatic lobes. Most of these measure in the 10-15 mm size range
although a large 5.6 x 8.9 cm lesions identified at the junction of
segments VII and VIII. 3.5 x 3.7 cm lesion is identified in segment
IV. Gallbladder is markedly distended. There is dramatic intra and
extrahepatic biliary duct dilatation. Common bile duct is dilated up
to 2.2 cm with relatively abrupt tapering in the head of the
pancreas with about 10-15 mm of "string sign" proximal to the
ampulla. This area is not well evaluated due to motion artifact.. No
obstructing mass lesion is evident although exam is limited by
motion artifact and lack of intravenous contrast material.

Pancreas: Not well seen due to motion artifact. No substantial
dilatation of the main pancreatic duct.

Spleen:  Unremarkable.

Adrenals/Urinary Tract: No adrenal nodule or mass. Small cysts are
noted in the kidneys bilaterally.

Stomach/Bowel: Stomach is nondilated. No bowel dilatation evident
within the visualized abdomen.

Vascular/Lymphatic: No abdominal aortic aneurysm. Flow void in the
portal vein and superior mesenteric vein suggests patency. No bulky
lymphadenopathy evident although assessment limited by lack of
contrast material.

Other:  Large volume ascites.

Musculoskeletal: Body wall edema. No abnormal marrow signal evident.
IMPRESSION: 1. Relatively dramatic dilatation of the intra and extrahepatic bile
ducts with marked distention of the gallbladder. Common bile duct is
2.2 cm in the head of the pancreas in tapers down to what appears to
be a 10-15 mm "string sign" tracking into the ampulla. This region
of the distal duct is not well evaluated due to lack of contrast and
motion artifact. Imaging appearance does raise the question of a
pancreatic head lesion causing obstruction. No evidence for
cholelithiasis or choledocholithiasis. ERCP/EUS would likely prove
helpful to further evaluate.
2. Innumerable liver lesions ranging in size from 10 mm up to 9 cm.
Imaging features compatible with metastatic disease and 1 of the
larger lesions may represent primary source.
3. Large volume ascites
4. Body wall edema

## 2020-04-30 IMAGING — US IR PARACENTESIS
1 series · 2 of 2 positions shown · non-contrast
Comparison: none

INDICATION: Abdominal distention. Metastatic pancreatic cancer. Recurrent
ascites. Request for therapeutic paracentesis.

[Series 1: ir (id) (id)/(id)/(id) ir · 2 of 2 slices shown]
[im 1/2]
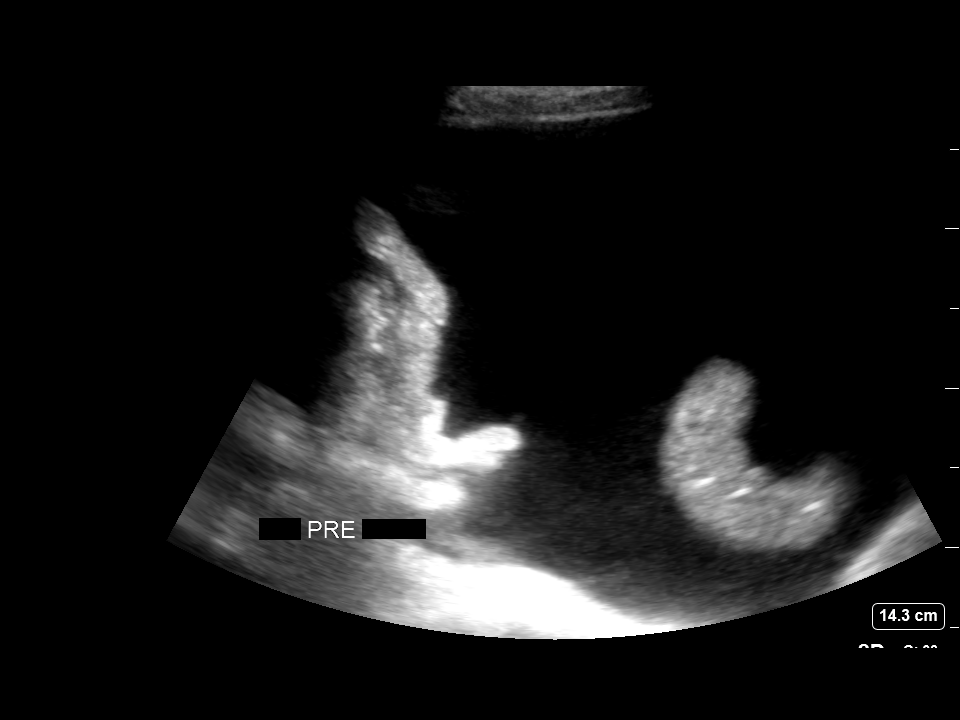
[im 2/2]
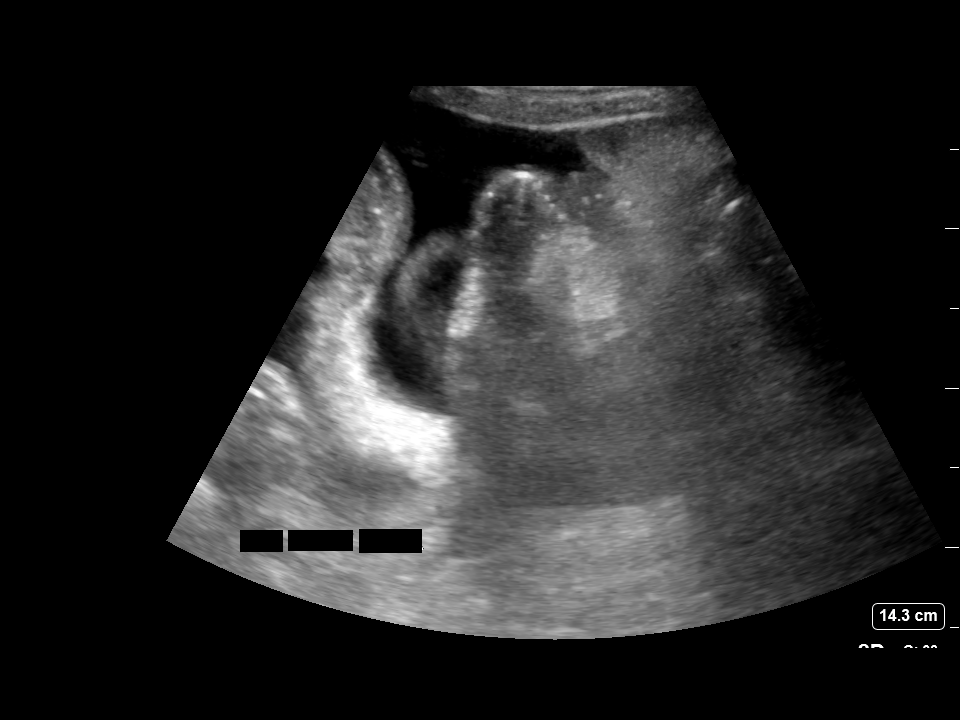

[2 of 2 positions shown; findings below may reference images not displayed]

EXAM:
ULTRASOUND GUIDED LEFT LOWER QUADRANT PARACENTESIS

MEDICATIONS:
None.

COMPLICATIONS:
None immediate.

PROCEDURE:
Informed written consent was obtained from the patient after a
discussion of the risks, benefits and alternatives to treatment. A
timeout was performed prior to the initiation of the procedure.

Initial ultrasound scanning demonstrates a large amount of ascites
within the left lower abdominal quadrant. The left lower abdomen was
prepped and draped in the usual sterile fashion. 1% lidocaine with
epinephrine was used for local anesthesia.

Following this, a 19 gauge, 7-cm, Yueh catheter was introduced. An
ultrasound image was saved for documentation purposes. The
paracentesis was performed. The catheter was removed and a dressing
was applied. The patient tolerated the procedure well without
immediate post procedural complication.
FINDINGS: A total of approximately 2.4 L of clear, dark golden fluid was
removed.
IMPRESSION: Successful ultrasound-guided paracentesis yielding 2.4 liters of
peritoneal fluid.
